# Patient Record
Sex: Male | Born: 1975 | Race: White | Hispanic: No | Marital: Married | Smoking: Never smoker
Health system: Southern US, Community
[De-identification: ages and names within clinical notes are randomized; demographics above are authoritative.]

## PROBLEM LIST (undated history)

## (undated) DIAGNOSIS — E109 Type 1 diabetes mellitus without complications: Secondary | ICD-10-CM

## (undated) DIAGNOSIS — F988 Other specified behavioral and emotional disorders with onset usually occurring in childhood and adolescence: Secondary | ICD-10-CM

## (undated) DIAGNOSIS — F329 Major depressive disorder, single episode, unspecified: Secondary | ICD-10-CM

## (undated) HISTORY — PX: OTHER SURGICAL HISTORY: SHX169

---
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
## 2016-08-29 DIAGNOSIS — I10 Essential (primary) hypertension: Secondary | ICD-10-CM | POA: Diagnosis not present

## 2016-08-29 DIAGNOSIS — E784 Other hyperlipidemia: Secondary | ICD-10-CM | POA: Diagnosis not present

## 2016-08-29 DIAGNOSIS — F900 Attention-deficit hyperactivity disorder, predominantly inattentive type: Secondary | ICD-10-CM | POA: Diagnosis not present

## 2016-08-29 DIAGNOSIS — N182 Chronic kidney disease, stage 2 (mild): Secondary | ICD-10-CM | POA: Diagnosis not present

## 2016-08-29 DIAGNOSIS — E1129 Type 2 diabetes mellitus with other diabetic kidney complication: Secondary | ICD-10-CM | POA: Diagnosis not present

## 2016-08-29 DIAGNOSIS — E668 Other obesity: Secondary | ICD-10-CM | POA: Diagnosis not present

## 2016-11-09 DIAGNOSIS — E1129 Type 2 diabetes mellitus with other diabetic kidney complication: Secondary | ICD-10-CM | POA: Diagnosis not present

## 2016-11-09 DIAGNOSIS — Z794 Long term (current) use of insulin: Secondary | ICD-10-CM | POA: Diagnosis not present

## 2016-11-09 DIAGNOSIS — I10 Essential (primary) hypertension: Secondary | ICD-10-CM | POA: Diagnosis not present

## 2016-11-09 DIAGNOSIS — E668 Other obesity: Secondary | ICD-10-CM | POA: Diagnosis not present

## 2016-11-09 DIAGNOSIS — E784 Other hyperlipidemia: Secondary | ICD-10-CM | POA: Diagnosis not present

## 2016-11-10 DIAGNOSIS — I10 Essential (primary) hypertension: Secondary | ICD-10-CM | POA: Diagnosis not present

## 2017-01-23 DIAGNOSIS — Z125 Encounter for screening for malignant neoplasm of prostate: Secondary | ICD-10-CM | POA: Diagnosis not present

## 2017-01-23 DIAGNOSIS — E1129 Type 2 diabetes mellitus with other diabetic kidney complication: Secondary | ICD-10-CM | POA: Diagnosis not present

## 2017-01-23 DIAGNOSIS — Z0000 Encounter for general adult medical examination without abnormal findings: Secondary | ICD-10-CM | POA: Diagnosis not present

## 2017-01-30 DIAGNOSIS — Z23 Encounter for immunization: Secondary | ICD-10-CM | POA: Diagnosis not present

## 2017-01-30 DIAGNOSIS — E1129 Type 2 diabetes mellitus with other diabetic kidney complication: Secondary | ICD-10-CM | POA: Diagnosis not present

## 2017-01-30 DIAGNOSIS — Z0000 Encounter for general adult medical examination without abnormal findings: Secondary | ICD-10-CM | POA: Diagnosis not present

## 2017-01-30 DIAGNOSIS — E668 Other obesity: Secondary | ICD-10-CM | POA: Diagnosis not present

## 2017-01-30 DIAGNOSIS — Z1389 Encounter for screening for other disorder: Secondary | ICD-10-CM | POA: Diagnosis not present

## 2017-01-30 DIAGNOSIS — Z794 Long term (current) use of insulin: Secondary | ICD-10-CM | POA: Diagnosis not present

## 2017-01-30 DIAGNOSIS — N182 Chronic kidney disease, stage 2 (mild): Secondary | ICD-10-CM | POA: Diagnosis not present

## 2017-03-08 DIAGNOSIS — E1129 Type 2 diabetes mellitus with other diabetic kidney complication: Secondary | ICD-10-CM | POA: Diagnosis not present

## 2017-03-08 DIAGNOSIS — N182 Chronic kidney disease, stage 2 (mild): Secondary | ICD-10-CM | POA: Diagnosis not present

## 2017-03-08 DIAGNOSIS — I10 Essential (primary) hypertension: Secondary | ICD-10-CM | POA: Diagnosis not present

## 2017-03-08 DIAGNOSIS — Z794 Long term (current) use of insulin: Secondary | ICD-10-CM | POA: Diagnosis not present

## 2017-03-26 DIAGNOSIS — E119 Type 2 diabetes mellitus without complications: Secondary | ICD-10-CM | POA: Diagnosis not present

## 2017-03-27 DIAGNOSIS — F909 Attention-deficit hyperactivity disorder, unspecified type: Secondary | ICD-10-CM | POA: Diagnosis not present

## 2017-04-13 DIAGNOSIS — Z794 Long term (current) use of insulin: Secondary | ICD-10-CM | POA: Diagnosis not present

## 2017-04-13 DIAGNOSIS — I10 Essential (primary) hypertension: Secondary | ICD-10-CM | POA: Diagnosis not present

## 2017-04-13 DIAGNOSIS — E1129 Type 2 diabetes mellitus with other diabetic kidney complication: Secondary | ICD-10-CM | POA: Diagnosis not present

## 2017-04-13 DIAGNOSIS — E668 Other obesity: Secondary | ICD-10-CM | POA: Diagnosis not present

## 2017-06-19 DIAGNOSIS — E860 Dehydration: Secondary | ICD-10-CM | POA: Diagnosis not present

## 2017-06-20 DIAGNOSIS — I10 Essential (primary) hypertension: Secondary | ICD-10-CM | POA: Diagnosis not present

## 2017-09-17 DIAGNOSIS — E1129 Type 2 diabetes mellitus with other diabetic kidney complication: Secondary | ICD-10-CM | POA: Diagnosis not present

## 2017-09-17 DIAGNOSIS — Z794 Long term (current) use of insulin: Secondary | ICD-10-CM | POA: Diagnosis not present

## 2017-09-17 DIAGNOSIS — E668 Other obesity: Secondary | ICD-10-CM | POA: Diagnosis not present

## 2017-09-17 DIAGNOSIS — E7849 Other hyperlipidemia: Secondary | ICD-10-CM | POA: Diagnosis not present

## 2017-12-22 DIAGNOSIS — L237 Allergic contact dermatitis due to plants, except food: Secondary | ICD-10-CM | POA: Diagnosis not present

## 2018-01-25 DIAGNOSIS — Z0000 Encounter for general adult medical examination without abnormal findings: Secondary | ICD-10-CM | POA: Diagnosis not present

## 2018-01-25 DIAGNOSIS — Z125 Encounter for screening for malignant neoplasm of prostate: Secondary | ICD-10-CM | POA: Diagnosis not present

## 2018-01-25 DIAGNOSIS — R82998 Other abnormal findings in urine: Secondary | ICD-10-CM | POA: Diagnosis not present

## 2018-01-25 DIAGNOSIS — E1129 Type 2 diabetes mellitus with other diabetic kidney complication: Secondary | ICD-10-CM | POA: Diagnosis not present

## 2018-02-06 DIAGNOSIS — E1129 Type 2 diabetes mellitus with other diabetic kidney complication: Secondary | ICD-10-CM | POA: Diagnosis not present

## 2018-02-06 DIAGNOSIS — N182 Chronic kidney disease, stage 2 (mild): Secondary | ICD-10-CM | POA: Diagnosis not present

## 2018-02-06 DIAGNOSIS — Z23 Encounter for immunization: Secondary | ICD-10-CM | POA: Diagnosis not present

## 2018-02-06 DIAGNOSIS — Z0000 Encounter for general adult medical examination without abnormal findings: Secondary | ICD-10-CM | POA: Diagnosis not present

## 2018-02-06 DIAGNOSIS — Z794 Long term (current) use of insulin: Secondary | ICD-10-CM | POA: Diagnosis not present

## 2018-02-06 DIAGNOSIS — E668 Other obesity: Secondary | ICD-10-CM | POA: Diagnosis not present

## 2018-02-06 DIAGNOSIS — Z1389 Encounter for screening for other disorder: Secondary | ICD-10-CM | POA: Diagnosis not present

## 2018-02-14 DIAGNOSIS — S91119A Laceration without foreign body of unspecified toe without damage to nail, initial encounter: Secondary | ICD-10-CM | POA: Diagnosis not present

## 2018-02-14 DIAGNOSIS — S99922A Unspecified injury of left foot, initial encounter: Secondary | ICD-10-CM | POA: Diagnosis not present

## 2018-02-14 DIAGNOSIS — M79672 Pain in left foot: Secondary | ICD-10-CM | POA: Diagnosis not present

## 2018-02-21 DIAGNOSIS — S99922S Unspecified injury of left foot, sequela: Secondary | ICD-10-CM | POA: Diagnosis not present

## 2018-02-21 DIAGNOSIS — S92405A Nondisplaced unspecified fracture of left great toe, initial encounter for closed fracture: Secondary | ICD-10-CM | POA: Diagnosis not present

## 2018-02-21 DIAGNOSIS — M79672 Pain in left foot: Secondary | ICD-10-CM | POA: Diagnosis not present

## 2018-02-23 ENCOUNTER — Encounter: Payer: Self-pay | Admitting: Behavioral Health

## 2018-02-23 DIAGNOSIS — F988 Other specified behavioral and emotional disorders with onset usually occurring in childhood and adolescence: Secondary | ICD-10-CM

## 2018-02-23 DIAGNOSIS — F319 Bipolar disorder, unspecified: Secondary | ICD-10-CM

## 2018-02-23 DIAGNOSIS — F341 Dysthymic disorder: Secondary | ICD-10-CM | POA: Insufficient documentation

## 2018-02-23 DIAGNOSIS — F902 Attention-deficit hyperactivity disorder, combined type: Secondary | ICD-10-CM | POA: Insufficient documentation

## 2018-02-23 DIAGNOSIS — F329 Major depressive disorder, single episode, unspecified: Secondary | ICD-10-CM

## 2018-02-23 DIAGNOSIS — F32A Depression, unspecified: Secondary | ICD-10-CM

## 2018-02-23 HISTORY — DX: Depression, unspecified: F32.A

## 2018-02-23 HISTORY — DX: Other specified behavioral and emotional disorders with onset usually occurring in childhood and adolescence: F98.8

## 2018-03-24 ENCOUNTER — Encounter: Payer: Self-pay | Admitting: Emergency Medicine

## 2018-04-02 ENCOUNTER — Ambulatory Visit (INDEPENDENT_AMBULATORY_CARE_PROVIDER_SITE_OTHER): Payer: BLUE CROSS/BLUE SHIELD | Admitting: Psychiatry

## 2018-04-02 DIAGNOSIS — E113291 Type 2 diabetes mellitus with mild nonproliferative diabetic retinopathy without macular edema, right eye: Secondary | ICD-10-CM | POA: Diagnosis not present

## 2018-04-02 DIAGNOSIS — F988 Other specified behavioral and emotional disorders with onset usually occurring in childhood and adolescence: Secondary | ICD-10-CM | POA: Diagnosis not present

## 2018-04-02 MED ORDER — METHYLPHENIDATE HCL ER (OSM) 36 MG PO TBCR: 36.0000 mg | EXTENDED_RELEASE_TABLET | Freq: Every day | ORAL | 0 refills | Status: DC

## 2018-04-02 NOTE — Progress Notes (Signed)
[facility]  Patient ID: Benjamin [name],  MRN: [id number]  PCP: Creola Corn, MD  Date of Evaluation: 04/02/2018 Time spent:20 minutes  Chief Complaint:   HISTORY/CURRENT STATUS: HPI patient is a 42 year old white male last seen 01/08/2018.  He was doing well at that time.  Been treated for ADD. He continues to do well. He and family recently moved.   Individual Medical History/ Review of Systems: Changes? :No   Allergies: Patient has no allergy information on record.  Current Medications:  Current Outpatient Medications:  .  [START ON 06/25/2018] methylphenidate 36 MG PO CR tablet, Take 1 tablet (36 mg total) by mouth daily., Disp: 30 tablet, Rfl: 0 .  lisdexamfetamine (VYVANSE) 30 MG capsule, Take 30 mg by mouth every morning., Disp: , Rfl:  Medication Side Effects: none  Family Medical/ Social History: Changes? No  MENTAL HEALTH EXAM:  There were no vitals taken for this visit.There is no height or weight on file to calculate BMI.  General Appearance: Casual  Eye Contact:  Good  Speech:  Normal Rate  Volume:  Normal  Mood:  Euthymic  Affect:  Appropriate  Thought Process:  Linear  Orientation:  Full (Time, Place, and Person)  Thought Content: Logical   Suicidal Thoughts:  No  Homicidal Thoughts:  No  Memory:  normal  Judgement:  Good  Insight:  Good  Psychomotor Activity:  Normal  Concentration:  Concentration: Good  Recall:  Good  Fund of Knowledge: Good  Language: Good  Assets:  Desire for Improvement  ADL's:  Intact  Cognition: WNL  Prognosis:  Good    DIAGNOSES:    ICD-10-CM   1. Attention deficit disorder, unspecified hyperactivity presence F98.8     Receiving Psychotherapy: No    RECOMMENDATIONS: no change in plan. Recheck 4 months.   Anne Fu, PA-C

## 2018-05-13 DIAGNOSIS — E1129 Type 2 diabetes mellitus with other diabetic kidney complication: Secondary | ICD-10-CM | POA: Diagnosis not present

## 2018-05-13 DIAGNOSIS — Z1389 Encounter for screening for other disorder: Secondary | ICD-10-CM | POA: Diagnosis not present

## 2018-07-10 DIAGNOSIS — M791 Myalgia, unspecified site: Secondary | ICD-10-CM | POA: Diagnosis not present

## 2018-07-10 DIAGNOSIS — J111 Influenza due to unidentified influenza virus with other respiratory manifestations: Secondary | ICD-10-CM | POA: Diagnosis not present

## 2018-07-10 DIAGNOSIS — E782 Mixed hyperlipidemia: Secondary | ICD-10-CM | POA: Diagnosis not present

## 2018-07-10 DIAGNOSIS — E1165 Type 2 diabetes mellitus with hyperglycemia: Secondary | ICD-10-CM | POA: Diagnosis not present

## 2018-08-02 ENCOUNTER — Ambulatory Visit (INDEPENDENT_AMBULATORY_CARE_PROVIDER_SITE_OTHER): Payer: BLUE CROSS/BLUE SHIELD | Admitting: Psychiatry

## 2018-08-02 DIAGNOSIS — F988 Other specified behavioral and emotional disorders with onset usually occurring in childhood and adolescence: Secondary | ICD-10-CM | POA: Diagnosis not present

## 2018-08-02 MED ORDER — METHYLPHENIDATE HCL ER (OSM) 36 MG PO TBCR: 36.0000 mg | EXTENDED_RELEASE_TABLET | Freq: Every day | ORAL | 0 refills | Status: DC

## 2018-08-02 NOTE — Progress Notes (Signed)
[facility]  Patient ID: MCADOO LIVERPOOL,  MRN: [id number]  PCP: Creola Corn, MD  Date of Evaluation: 08/02/2018 Time spent:20 minutes  Chief Complaint:   HISTORY/CURRENT STATUS: HPI patient seen 4 months ago was doing well. Continues to do well.  Individual Medical History/ Review of Systems: Changes? :No   Allergies: Patient has no allergy information on record.  Current Medications:  Current Outpatient Medications:  .  methylphenidate 36 MG PO CR tablet, Take 1 tablet (36 mg total) by mouth daily., Disp: 30 tablet, Rfl: 0 .  lisdexamfetamine (VYVANSE) 30 MG capsule, Take 30 mg by mouth every morning., Disp: , Rfl:  .  methylphenidate (CONCERTA) 36 MG PO CR tablet, Take 1 tablet (36 mg total) by mouth daily., Disp: 30 tablet, Rfl: 0 .  methylphenidate (CONCERTA) 36 MG PO CR tablet, Take 1 tablet (36 mg total) by mouth daily., Disp: 30 tablet, Rfl: 0 Medication Side Effects: no  Family Medical/ Social History: Changes? no  MENTAL HEALTH EXAM:  There were no vitals taken for this visit.There is no height or weight on file to calculate BMI. 154/100 with pulse 100  General Appearance: Casual  Eye Contact:  Good  Speech:  Clear and Coherent  Volume:  Normal  Mood:  Euthymic  Affect:  Appropriate  Thought Process:  Linear  Orientation:  Full (Time, Place, and Person)  Thought Content: Logical   Suicidal Thoughts:  No  Homicidal Thoughts:  No  Memory:  WNL  Judgement:  Good  Insight:  Good  Psychomotor Activity:  Normal  Concentration:  Concentration: Good  Recall:  Good  Fund of Knowledge: Good  Language: Good  Assets:  Desire for Improvement  ADL's:  Intact  Cognition: WNL  Prognosis:  Good    DIAGNOSES:    ICD-10-CM   1. Attention deficit disorder, unspecified hyperactivity presence F98.8     Receiving Psychotherapy: No    RECOMMENDATIONS: doing well. No change from Concerta cr 36 mg/day. Follow VS and call if elevated. Recheck 3  months   Anne Fu, [place]

## 2018-08-08 DIAGNOSIS — E668 Other obesity: Secondary | ICD-10-CM | POA: Diagnosis not present

## 2018-08-08 DIAGNOSIS — N182 Chronic kidney disease, stage 2 (mild): Secondary | ICD-10-CM | POA: Diagnosis not present

## 2018-08-08 DIAGNOSIS — I10 Essential (primary) hypertension: Secondary | ICD-10-CM | POA: Diagnosis not present

## 2018-08-08 DIAGNOSIS — E1129 Type 2 diabetes mellitus with other diabetic kidney complication: Secondary | ICD-10-CM | POA: Diagnosis not present

## 2018-11-01 ENCOUNTER — Ambulatory Visit: Payer: BLUE CROSS/BLUE SHIELD | Admitting: Psychiatry

## 2018-11-04 ENCOUNTER — Other Ambulatory Visit: Payer: Self-pay

## 2018-11-04 ENCOUNTER — Ambulatory Visit (INDEPENDENT_AMBULATORY_CARE_PROVIDER_SITE_OTHER): Payer: PRIVATE HEALTH INSURANCE | Admitting: Psychiatry

## 2018-11-04 ENCOUNTER — Encounter: Payer: Self-pay | Admitting: Psychiatry

## 2018-11-04 VITALS — BP 118/84 | HR 78 | Ht 71.5 in | Wt 224.0 lb

## 2018-11-04 DIAGNOSIS — E1129 Type 2 diabetes mellitus with other diabetic kidney complication: Secondary | ICD-10-CM | POA: Diagnosis not present

## 2018-11-04 DIAGNOSIS — E669 Obesity, unspecified: Secondary | ICD-10-CM | POA: Diagnosis not present

## 2018-11-04 DIAGNOSIS — I10 Essential (primary) hypertension: Secondary | ICD-10-CM | POA: Diagnosis not present

## 2018-11-04 DIAGNOSIS — Z794 Long term (current) use of insulin: Secondary | ICD-10-CM | POA: Diagnosis not present

## 2018-11-04 DIAGNOSIS — F341 Dysthymic disorder: Secondary | ICD-10-CM | POA: Diagnosis not present

## 2018-11-04 DIAGNOSIS — F902 Attention-deficit hyperactivity disorder, combined type: Secondary | ICD-10-CM | POA: Diagnosis not present

## 2018-11-04 MED ORDER — METHYLPHENIDATE HCL ER (OSM) 27 MG PO TBCR: 27.0000 mg | EXTENDED_RELEASE_TABLET | Freq: Every day | ORAL | 0 refills | Status: DC
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
## 2018-11-11 DIAGNOSIS — Z794 Long term (current) use of insulin: Secondary | ICD-10-CM | POA: Diagnosis not present

## 2018-11-11 DIAGNOSIS — L739 Follicular disorder, unspecified: Secondary | ICD-10-CM | POA: Diagnosis not present

## 2018-11-11 DIAGNOSIS — L239 Allergic contact dermatitis, unspecified cause: Secondary | ICD-10-CM | POA: Diagnosis not present

## 2018-11-11 DIAGNOSIS — E1129 Type 2 diabetes mellitus with other diabetic kidney complication: Secondary | ICD-10-CM | POA: Diagnosis not present

## 2018-12-25 DIAGNOSIS — Z03818 Encounter for observation for suspected exposure to other biological agents ruled out: Secondary | ICD-10-CM | POA: Diagnosis not present

## 2019-01-21 DIAGNOSIS — M7502 Adhesive capsulitis of left shoulder: Secondary | ICD-10-CM | POA: Diagnosis not present

## 2019-01-21 DIAGNOSIS — R52 Pain, unspecified: Secondary | ICD-10-CM | POA: Diagnosis not present

## 2019-02-04 ENCOUNTER — Encounter: Payer: Self-pay | Admitting: Psychiatry

## 2019-02-04 ENCOUNTER — Ambulatory Visit (INDEPENDENT_AMBULATORY_CARE_PROVIDER_SITE_OTHER): Payer: PRIVATE HEALTH INSURANCE | Admitting: Psychiatry

## 2019-02-04 ENCOUNTER — Other Ambulatory Visit: Payer: Self-pay

## 2019-02-04 VITALS — Ht 71.5 in | Wt 214.0 lb

## 2019-02-04 DIAGNOSIS — F902 Attention-deficit hyperactivity disorder, combined type: Secondary | ICD-10-CM

## 2019-02-04 DIAGNOSIS — F341 Dysthymic disorder: Secondary | ICD-10-CM

## 2019-02-04 MED ORDER — METHYLPHENIDATE HCL ER (OSM) 27 MG PO TBCR: 27.0000 mg | EXTENDED_RELEASE_TABLET | Freq: Every day | ORAL | 0 refills | Status: DC
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
## 2019-02-07 DIAGNOSIS — E1129 Type 2 diabetes mellitus with other diabetic kidney complication: Secondary | ICD-10-CM | POA: Diagnosis not present

## 2019-02-07 DIAGNOSIS — Z0000 Encounter for general adult medical examination without abnormal findings: Secondary | ICD-10-CM | POA: Diagnosis not present

## 2019-02-07 DIAGNOSIS — Z125 Encounter for screening for malignant neoplasm of prostate: Secondary | ICD-10-CM | POA: Diagnosis not present

## 2019-02-14 DIAGNOSIS — Z0000 Encounter for general adult medical examination without abnormal findings: Secondary | ICD-10-CM | POA: Diagnosis not present

## 2019-02-14 DIAGNOSIS — E1129 Type 2 diabetes mellitus with other diabetic kidney complication: Secondary | ICD-10-CM | POA: Diagnosis not present

## 2019-02-14 DIAGNOSIS — E11319 Type 2 diabetes mellitus with unspecified diabetic retinopathy without macular edema: Secondary | ICD-10-CM | POA: Diagnosis not present

## 2019-02-14 DIAGNOSIS — E785 Hyperlipidemia, unspecified: Secondary | ICD-10-CM | POA: Diagnosis not present

## 2019-02-14 DIAGNOSIS — M7502 Adhesive capsulitis of left shoulder: Secondary | ICD-10-CM | POA: Diagnosis not present

## 2019-02-14 DIAGNOSIS — Z1331 Encounter for screening for depression: Secondary | ICD-10-CM | POA: Diagnosis not present

## 2019-02-18 ENCOUNTER — Other Ambulatory Visit: Payer: Self-pay | Admitting: Internal Medicine

## 2019-02-18 DIAGNOSIS — E785 Hyperlipidemia, unspecified: Secondary | ICD-10-CM

## 2019-02-19 DIAGNOSIS — I10 Essential (primary) hypertension: Secondary | ICD-10-CM | POA: Diagnosis not present

## 2019-02-19 DIAGNOSIS — E10319 Type 1 diabetes mellitus with unspecified diabetic retinopathy without macular edema: Secondary | ICD-10-CM | POA: Diagnosis not present

## 2019-02-19 DIAGNOSIS — N182 Chronic kidney disease, stage 2 (mild): Secondary | ICD-10-CM | POA: Diagnosis not present

## 2019-02-19 DIAGNOSIS — Z794 Long term (current) use of insulin: Secondary | ICD-10-CM | POA: Diagnosis not present

## 2019-02-19 DIAGNOSIS — Z23 Encounter for immunization: Secondary | ICD-10-CM | POA: Diagnosis not present

## 2019-05-19 DIAGNOSIS — Z20828 Contact with and (suspected) exposure to other viral communicable diseases: Secondary | ICD-10-CM | POA: Diagnosis not present

## 2019-05-19 DIAGNOSIS — R52 Pain, unspecified: Secondary | ICD-10-CM | POA: Diagnosis not present

## 2019-05-19 DIAGNOSIS — U071 COVID-19: Secondary | ICD-10-CM | POA: Diagnosis not present

## 2019-05-19 DIAGNOSIS — R5383 Other fatigue: Secondary | ICD-10-CM | POA: Diagnosis not present

## 2019-05-27 ENCOUNTER — Telehealth: Payer: Self-pay | Admitting: Unknown Physician Specialty

## 2019-05-27 NOTE — Telephone Encounter (Signed)
Called to discuss with patient about Covid symptoms and the use of bamlanivimab, a monoclonal antibody infusion for those with mild to moderate Covid symptoms and at a high risk of hospitalization.  Pt is not qualified for this infusion due to age <65 and lack of qualifying co-morbid conditions.     Message left to call back for questions

## 2019-05-28 ENCOUNTER — Telehealth: Payer: Self-pay | Admitting: Unknown Physician Specialty

## 2019-05-28 ENCOUNTER — Other Ambulatory Visit: Payer: Self-pay | Admitting: Unknown Physician Specialty

## 2019-05-28 ENCOUNTER — Other Ambulatory Visit: Payer: Self-pay | Admitting: Critical Care Medicine

## 2019-05-28 DIAGNOSIS — E1165 Type 2 diabetes mellitus with hyperglycemia: Secondary | ICD-10-CM

## 2019-05-28 DIAGNOSIS — U071 COVID-19: Secondary | ICD-10-CM

## 2019-05-28 NOTE — Telephone Encounter (Signed)
Left message yesterday.  Number in chart wrong.     I connected by phone with Benjamin [name] on 05/28/2019 at 11:38 AM to discuss the potential use of an new treatment for mild to moderate COVID-19 viral infection in non-hospitalized patients.  This patient is a 43 y.o. male that meets the FDA criteria for Emergency Use Authorization of bamlanivimab or casirivimab\imdevimab.  Has a (+) direct SARS-CoV-2 viral test result  Has mild or moderate COVID-19   Is ? 42 years of age and weighs ? 40 kg  Is NOT hospitalized due to COVID-19  Is NOT requiring oxygen therapy or requiring an increase in baseline oxygen flow rate due to COVID-19  Is within 10 days of symptom onset  Has at least one of the high risk factor(s) for progression to severe COVID-19 and/or hospitalization as defined in EUA.  Specific high risk criteria : Diabetes   I have spoken and communicated the following to the patient or parent/caregiver:  1. FDA has authorized the emergency use of bamlanivimab and casirivimab\imdevimab for the treatment of mild to moderate COVID-19 in adults and pediatric patients with positive results of direct SARS-CoV-2 viral testing who are 45 years of age and older weighing at least 40 kg, and who are at high risk for progressing to severe COVID-19 and/or hospitalization.  2. The significant known and potential risks and benefits of bamlanivimab and casirivimab\imdevimab, and the extent to which such potential risks and benefits are unknown.  3. Information on available alternative treatments and the risks and benefits of those alternatives, including clinical trials.  4. Patients treated with bamlanivimab and casirivimab\imdevimab should continue to self-isolate and use infection control measures (e.g., wear mask, isolate, social distance, avoid sharing personal items, clean and disinfect "high touch" surfaces, and frequent handwashing) according to CDC guidelines.   5. The patient or  parent/caregiver has the option to accept or refuse bamlanivimab or casirivimab\imdevimab .  After reviewing this information with the patient, The patient agreed to proceed with receiving the casirivimab\imdevimab infusion and will be provided a copy of the Fact sheet prior to receiving the infusion.[name] 05/28/2019 11:38 AM  Symptoms for 9 days.

## 2019-05-28 NOTE — Progress Notes (Signed)
  I connected by phone with Benjamin [name] on 05/28/2019 at 12:06 PM to discuss the potential use of an new treatment for mild to moderate COVID-19 viral infection in non-hospitalized patients.  This patient is a 43 y.o. male that meets the FDA criteria for Emergency Use Authorization of bamlanivimab or casirivimab\imdevimab.  Has a (+) direct SARS-CoV-2 viral test result  Has mild or moderate COVID-19   Is ? 42 years of age and weighs ? 40 kg  Is NOT hospitalized due to COVID-19  Is NOT requiring oxygen therapy or requiring an increase in baseline oxygen flow rate due to COVID-19  Is within 10 days of symptom onset  Has at least one of the high risk factor(s) for progression to severe COVID-19 and/or hospitalization as defined in EUA.  Specific high risk criteria : Diabetes   I have spoken and communicated the following to the patient or parent/caregiver:  1. FDA has authorized the emergency use of bamlanivimab and casirivimab\imdevimab for the treatment of mild to moderate COVID-19 in adults and pediatric patients with positive results of direct SARS-CoV-2 viral testing who are 67 years of age and older weighing at least 40 kg, and who are at high risk for progressing to severe COVID-19 and/or hospitalization.  2. The significant known and potential risks and benefits of bamlanivimab and casirivimab\imdevimab, and the extent to which such potential risks and benefits are unknown.  3. Information on available alternative treatments and the risks and benefits of those alternatives, including clinical trials.  4. Patients treated with bamlanivimab and casirivimab\imdevimab should continue to self-isolate and use infection control measures (e.g., wear mask, isolate, social distance, avoid sharing personal items, clean and disinfect "high touch" surfaces, and frequent handwashing) according to CDC guidelines.   5. The patient or parent/caregiver has the option to accept or refuse  bamlanivimab or casirivimab\imdevimab .  After reviewing this information with the patient, The patient agreed to proceed with receiving the bamlanimivab infusion and will be provided a copy of the Fact sheet prior to receiving the infusion.Benjamin [name] 05/28/2019 12:06 PM

## 2019-05-29 ENCOUNTER — Emergency Department (HOSPITAL_COMMUNITY): Payer: BC Managed Care – PPO

## 2019-05-29 ENCOUNTER — Inpatient Hospital Stay (HOSPITAL_COMMUNITY)
Admission: EM | Admit: 2019-05-29 | Discharge: 2019-06-03 | DRG: 177 | Disposition: A | Discharge status: Home / Self Care | Payer: BC Managed Care – PPO | Attending: Internal Medicine | Admitting: Internal Medicine

## 2019-05-29 ENCOUNTER — Inpatient Hospital Stay (HOSPITAL_COMMUNITY): Admission: RE | Admit: 2019-05-29 | Payer: Self-pay | Source: Ambulatory Visit

## 2019-05-29 ENCOUNTER — Other Ambulatory Visit: Payer: Self-pay

## 2019-05-29 ENCOUNTER — Encounter (HOSPITAL_COMMUNITY): Payer: Self-pay | Admitting: Emergency Medicine

## 2019-05-29 DIAGNOSIS — Z79899 Other long term (current) drug therapy: Secondary | ICD-10-CM | POA: Diagnosis not present

## 2019-05-29 DIAGNOSIS — E876 Hypokalemia: Secondary | ICD-10-CM | POA: Diagnosis not present

## 2019-05-29 DIAGNOSIS — U071 COVID-19: Secondary | ICD-10-CM | POA: Diagnosis not present

## 2019-05-29 DIAGNOSIS — R680 Hypothermia, not associated with low environmental temperature: Secondary | ICD-10-CM | POA: Diagnosis present

## 2019-05-29 DIAGNOSIS — E1010 Type 1 diabetes mellitus with ketoacidosis without coma: Secondary | ICD-10-CM

## 2019-05-29 DIAGNOSIS — F909 Attention-deficit hyperactivity disorder, unspecified type: Secondary | ICD-10-CM | POA: Diagnosis present

## 2019-05-29 DIAGNOSIS — R531 Weakness: Secondary | ICD-10-CM | POA: Diagnosis not present

## 2019-05-29 DIAGNOSIS — E872 Acidosis: Secondary | ICD-10-CM | POA: Diagnosis not present

## 2019-05-29 DIAGNOSIS — E860 Dehydration: Secondary | ICD-10-CM | POA: Diagnosis not present

## 2019-05-29 DIAGNOSIS — I10 Essential (primary) hypertension: Secondary | ICD-10-CM | POA: Diagnosis not present

## 2019-05-29 DIAGNOSIS — N179 Acute kidney failure, unspecified: Secondary | ICD-10-CM | POA: Diagnosis not present

## 2019-05-29 DIAGNOSIS — Z888 Allergy status to other drugs, medicaments and biological substances status: Secondary | ICD-10-CM | POA: Diagnosis not present

## 2019-05-29 DIAGNOSIS — E0810 Diabetes mellitus due to underlying condition with ketoacidosis without coma: Secondary | ICD-10-CM | POA: Diagnosis not present

## 2019-05-29 DIAGNOSIS — F1290 Cannabis use, unspecified, uncomplicated: Secondary | ICD-10-CM | POA: Diagnosis not present

## 2019-05-29 DIAGNOSIS — R109 Unspecified abdominal pain: Secondary | ICD-10-CM | POA: Diagnosis not present

## 2019-05-29 DIAGNOSIS — Z794 Long term (current) use of insulin: Secondary | ICD-10-CM | POA: Diagnosis not present

## 2019-05-29 DIAGNOSIS — E875 Hyperkalemia: Secondary | ICD-10-CM | POA: Diagnosis present

## 2019-05-29 DIAGNOSIS — E785 Hyperlipidemia, unspecified: Secondary | ICD-10-CM | POA: Diagnosis present

## 2019-05-29 DIAGNOSIS — E1110 Type 2 diabetes mellitus with ketoacidosis without coma: Secondary | ICD-10-CM

## 2019-05-29 DIAGNOSIS — R112 Nausea with vomiting, unspecified: Secondary | ICD-10-CM

## 2019-05-29 HISTORY — DX: Type 1 diabetes mellitus without complications: E10.9

## 2019-05-29 LAB — CBC
HCT: 49.6 % (ref 39.0–52.0)
Hemoglobin: 16.8 g/dL (ref 13.0–17.0)
MCH: 29.5 pg (ref 26.0–34.0)
MCHC: 33.9 g/dL (ref 30.0–36.0)
MCV: 87 fL (ref 80.0–100.0)
Platelets: 308 10*3/uL (ref 150–400)
RBC: 5.7 MIL/uL (ref 4.22–5.81)
RDW: 12.2 % (ref 11.5–15.5)
WBC: 18.3 10*3/uL — ABNORMAL HIGH (ref 4.0–10.5)
nRBC: 0 % (ref 0.0–0.2)

## 2019-05-29 LAB — CBC WITH DIFFERENTIAL/PLATELET
Abs Immature Granulocytes: 0.39 10*3/uL — ABNORMAL HIGH (ref 0.00–0.07)
Basophils Absolute: 0.1 10*3/uL (ref 0.0–0.1)
Basophils Relative: 1 %
Eosinophils Absolute: 0.3 10*3/uL (ref 0.0–0.5)
Eosinophils Relative: 2 %
HCT: 58.2 % — ABNORMAL HIGH (ref 39.0–52.0)
Hemoglobin: 19.2 g/dL — ABNORMAL HIGH (ref 13.0–17.0)
Immature Granulocytes: 2 %
Lymphocytes Relative: 9 %
Lymphs Abs: 1.5 10*3/uL (ref 0.7–4.0)
MCH: 29.1 pg (ref 26.0–34.0)
MCHC: 33 g/dL (ref 30.0–36.0)
MCV: 88.3 fL (ref 80.0–100.0)
Monocytes Absolute: 1.1 10*3/uL — ABNORMAL HIGH (ref 0.1–1.0)
Monocytes Relative: 7 %
Neutro Abs: 13.7 10*3/uL — ABNORMAL HIGH (ref 1.7–7.7)
Neutrophils Relative %: 79 %
Platelets: 451 10*3/uL — ABNORMAL HIGH (ref 150–400)
RBC: 6.59 MIL/uL — ABNORMAL HIGH (ref 4.22–5.81)
RDW: 12.6 % (ref 11.5–15.5)
WBC: 17.1 10*3/uL — ABNORMAL HIGH (ref 4.0–10.5)
nRBC: 0 % (ref 0.0–0.2)

## 2019-05-29 LAB — BETA-HYDROXYBUTYRIC ACID: Beta-Hydroxybutyric Acid: >8 mmol/L — ABNORMAL HIGH (ref 0.05–0.27)

## 2019-05-29 LAB — BLOOD GAS, VENOUS
Acid-base deficit: 27.3 mmol/L — ABNORMAL HIGH (ref 0.0–2.0)
Bicarbonate: 5 mmol/L — ABNORMAL LOW (ref 20.0–28.0)
O2 Saturation: 72.8 %
Patient temperature: 98.6
pCO2, Ven: 19.5 mmHg — CL (ref 44.0–60.0)
pH, Ven: 7.034 — CL (ref 7.250–7.430)
pO2, Ven: 46.6 mmHg — ABNORMAL HIGH (ref 32.0–45.0)

## 2019-05-29 LAB — COMPREHENSIVE METABOLIC PANEL
ALT: 22 U/L (ref 0–44)
AST: 16 U/L (ref 15–41)
Albumin: 3.9 g/dL (ref 3.5–5.0)
Alkaline Phosphatase: 95 U/L (ref 38–126)
BUN: 54 mg/dL — ABNORMAL HIGH (ref 6–20)
CO2: <7 mmol/L — ABNORMAL LOW (ref 22–32)
Calcium: 9.6 mg/dL (ref 8.9–10.3)
Chloride: 93 mmol/L — ABNORMAL LOW (ref 98–111)
Creatinine, Ser: 2.31 mg/dL — ABNORMAL HIGH (ref 0.61–1.24)
GFR calc Af Amer: 39 mL/min — ABNORMAL LOW (ref >60)
GFR calc non Af Amer: 34 mL/min — ABNORMAL LOW (ref >60)
Glucose, Bld: 719 mg/dL (ref 70–99)
Potassium: 5.3 mmol/L — ABNORMAL HIGH (ref 3.5–5.1)
Sodium: 131 mmol/L — ABNORMAL LOW (ref 135–145)
Total Bilirubin: 2 mg/dL — ABNORMAL HIGH (ref 0.3–1.2)
Total Protein: 9.1 g/dL — ABNORMAL HIGH (ref 6.5–8.1)

## 2019-05-29 LAB — BASIC METABOLIC PANEL
Anion gap: 23 — ABNORMAL HIGH (ref 5–15)
BUN: 50 mg/dL — ABNORMAL HIGH (ref 6–20)
BUN: 49 mg/dL — ABNORMAL HIGH (ref 6–20)
CO2: <7 mmol/L — ABNORMAL LOW (ref 22–32)
CO2: 9 mmol/L — ABNORMAL LOW (ref 22–32)
Calcium: 7.7 mg/dL — ABNORMAL LOW (ref 8.9–10.3)
Calcium: 8.7 mg/dL — ABNORMAL LOW (ref 8.9–10.3)
Chloride: 112 mmol/L — ABNORMAL HIGH (ref 98–111)
Chloride: 108 mmol/L (ref 98–111)
Creatinine, Ser: 1.82 mg/dL — ABNORMAL HIGH (ref 0.61–1.24)
Creatinine, Ser: 1.8 mg/dL — ABNORMAL HIGH (ref 0.61–1.24)
GFR calc Af Amer: 52 mL/min — ABNORMAL LOW (ref >60)
GFR calc Af Amer: 53 mL/min — ABNORMAL LOW (ref >60)
GFR calc non Af Amer: 45 mL/min — ABNORMAL LOW (ref >60)
GFR calc non Af Amer: 45 mL/min — ABNORMAL LOW (ref >60)
Glucose, Bld: 394 mg/dL — ABNORMAL HIGH (ref 70–99)
Glucose, Bld: 344 mg/dL — ABNORMAL HIGH (ref 70–99)
Potassium: 3.6 mmol/L (ref 3.5–5.1)
Potassium: 4.7 mmol/L (ref 3.5–5.1)
Sodium: 138 mmol/L (ref 135–145)
Sodium: 140 mmol/L (ref 135–145)

## 2019-05-29 LAB — URINALYSIS, ROUTINE W REFLEX MICROSCOPIC
Bacteria, UA: NONE SEEN
Bilirubin Urine: NEGATIVE
Glucose, UA: >=500 mg/dL — AB
Ketones, ur: 80 mg/dL — AB
Leukocytes,Ua: NEGATIVE
Nitrite: NEGATIVE
Protein, ur: 100 mg/dL — AB
Specific Gravity, Urine: 1.018 (ref 1.005–1.030)
pH: 5 (ref 5.0–8.0)

## 2019-05-29 LAB — MRSA PCR SCREENING: MRSA by PCR: NEGATIVE

## 2019-05-29 LAB — PROCALCITONIN: Procalcitonin: <0.1 ng/mL

## 2019-05-29 LAB — LACTIC ACID, PLASMA
Lactic Acid, Venous: 3.8 mmol/L (ref 0.5–1.9)
Lactic Acid, Venous: 2.7 mmol/L (ref 0.5–1.9)
Lactic Acid, Venous: 2.8 mmol/L (ref 0.5–1.9)

## 2019-05-29 LAB — CBG MONITORING, ED
Glucose-Capillary: >600 mg/dL (ref 70–99)
Glucose-Capillary: >600 mg/dL (ref 70–99)
Glucose-Capillary: 561 mg/dL (ref 70–99)
Glucose-Capillary: 450 mg/dL — ABNORMAL HIGH (ref 70–99)

## 2019-05-29 LAB — HIV ANTIBODY (ROUTINE TESTING W REFLEX): HIV Screen 4th Generation wRfx: NONREACTIVE

## 2019-05-29 LAB — TROPONIN I (HIGH SENSITIVITY)
Troponin I (High Sensitivity): 11 ng/L (ref <18)
Troponin I (High Sensitivity): 11 ng/L (ref <18)

## 2019-05-29 LAB — FIBRINOGEN: Fibrinogen: >800 mg/dL — ABNORMAL HIGH (ref 210–475)

## 2019-05-29 LAB — LACTATE DEHYDROGENASE: LDH: 195 U/L — ABNORMAL HIGH (ref 98–192)

## 2019-05-29 LAB — C-REACTIVE PROTEIN: CRP: 2.2 mg/dL — ABNORMAL HIGH (ref <1.0)

## 2019-05-29 LAB — GLUCOSE, CAPILLARY
Glucose-Capillary: 312 mg/dL — ABNORMAL HIGH (ref 70–99)
Glucose-Capillary: 236 mg/dL — ABNORMAL HIGH (ref 70–99)
Glucose-Capillary: 225 mg/dL — ABNORMAL HIGH (ref 70–99)
Glucose-Capillary: 205 mg/dL — ABNORMAL HIGH (ref 70–99)

## 2019-05-29 LAB — TRIGLYCERIDES: Triglycerides: 787 mg/dL — ABNORMAL HIGH (ref <150)

## 2019-05-29 LAB — D-DIMER, QUANTITATIVE: D-Dimer, Quant: 2.65 ug/mL-FEU — ABNORMAL HIGH (ref 0.00–0.50)

## 2019-05-29 LAB — FERRITIN: Ferritin: 4967 ng/mL — ABNORMAL HIGH (ref 24–336)

## 2019-05-29 LAB — ABO/RH: ABO/RH(D): A POS

## 2019-05-29 MED ORDER — LISINOPRIL 10 MG PO TABS
20.0000 mg | ORAL_TABLET | Freq: Every day | ORAL | Status: DC
  Administered 2019-05-29 – 2019-05-30 (×2): 20 mg via ORAL
  Filled 2019-05-29 (×2): qty 2

## 2019-05-29 MED ORDER — DEXTROSE-NACL 5-0.45 % IV SOLN: INTRAVENOUS | Status: DC

## 2019-05-29 MED ORDER — SODIUM CHLORIDE 0.9 % IV BOLUS
1000.0000 mL | Freq: Once | INTRAVENOUS | Status: AC
  Administered 2019-05-29: 1000 mL via INTRAVENOUS

## 2019-05-29 MED ORDER — POTASSIUM CHLORIDE 10 MEQ/100ML IV SOLN
10.0000 meq | INTRAVENOUS | Status: AC
  Administered 2019-05-29 (×4): 10 meq via INTRAVENOUS
  Filled 2019-05-29 (×4): qty 100

## 2019-05-29 MED ORDER — INSULIN REGULAR(HUMAN) IN NACL 100-0.9 UT/100ML-% IV SOLN
INTRAVENOUS | Status: DC
  Administered 2019-05-29: 14 [IU]/h via INTRAVENOUS
  Administered 2019-05-29: 13 [IU]/h via INTRAVENOUS
  Administered 2019-05-30: 15:00:00 5.5 [IU]/h via INTRAVENOUS
  Filled 2019-05-29 (×3): qty 100

## 2019-05-29 MED ORDER — LACTATED RINGERS IV BOLUS
1000.0000 mL | Freq: Once | INTRAVENOUS | Status: AC
  Administered 2019-05-29: 1000 mL via INTRAVENOUS

## 2019-05-29 MED ORDER — SODIUM CHLORIDE 0.9 % IV SOLN
100.0000 mg | Freq: Every day | INTRAVENOUS | Status: AC
  Administered 2019-05-30 – 2019-06-02 (×4): 100 mg via INTRAVENOUS
  Filled 2019-05-29 (×4): qty 100

## 2019-05-29 MED ORDER — SODIUM CHLORIDE 0.9 % IV SOLN: INTRAVENOUS | Status: DC

## 2019-05-29 MED ORDER — SODIUM CHLORIDE 0.9 % IV SOLN
200.0000 mg | Freq: Once | INTRAVENOUS | Status: AC
  Administered 2019-05-29: 200 mg via INTRAVENOUS
  Filled 2019-05-29: qty 200

## 2019-05-29 MED ORDER — DEXTROSE 50 % IV SOLN: 0.0000 mL | INTRAVENOUS | Status: DC | PRN

## 2019-05-29 MED ORDER — ONDANSETRON HCL 4 MG/2ML IJ SOLN
4.0000 mg | Freq: Once | INTRAMUSCULAR | Status: AC
  Administered 2019-05-29: 4 mg via INTRAVENOUS

## 2019-05-29 MED ORDER — ENOXAPARIN SODIUM 40 MG/0.4ML ~~LOC~~ SOLN
40.0000 mg | SUBCUTANEOUS | Status: DC
  Administered 2019-05-29 – 2019-06-02 (×5): 40 mg via SUBCUTANEOUS
  Filled 2019-05-29 (×6): qty 0.4

## 2019-05-29 MED ORDER — SODIUM CHLORIDE 0.9 % IV SOLN
8.0000 mg | Freq: Three times a day (TID) | INTRAVENOUS | Status: DC
  Filled 2019-05-29 (×3): qty 4

## 2019-05-29 MED ORDER — LACTATED RINGERS IV SOLN: INTRAVENOUS | Status: DC

## 2019-05-29 MED ORDER — METHYLPHENIDATE HCL ER (OSM) 27 MG PO TBCR: 27.0000 mg | EXTENDED_RELEASE_TABLET | Freq: Every day | ORAL | Status: DC

## 2019-05-29 MED ORDER — PHENOL 1.4 % MT LIQD
1.0000 | OROMUCOSAL | Status: DC | PRN
  Administered 2019-05-29: 1 via OROMUCOSAL
  Filled 2019-05-29: qty 177

## 2019-05-29 MED ORDER — ONDANSETRON HCL 4 MG/2ML IJ SOLN
INTRAMUSCULAR | Status: AC
  Filled 2019-05-29: qty 4

## 2019-05-29 MED ORDER — SODIUM CHLORIDE 0.9 % IV BOLUS
500.0000 mL | Freq: Once | INTRAVENOUS | Status: AC
  Administered 2019-05-29: 500 mL via INTRAVENOUS

## 2019-05-29 MED ORDER — ORAL CARE MOUTH RINSE
15.0000 mL | Freq: Two times a day (BID) | OROMUCOSAL | Status: DC
  Administered 2019-05-29 – 2019-06-03 (×8): 15 mL via OROMUCOSAL

## 2019-05-29 MED ORDER — SODIUM CHLORIDE (PF) 0.9 % IJ SOLN
INTRAMUSCULAR | Status: AC
  Filled 2019-05-29: qty 50

## 2019-05-29 MED ORDER — LORAZEPAM 2 MG/ML IJ SOLN
0.5000 mg | Freq: Once | INTRAMUSCULAR | Status: AC
  Administered 2019-05-29: 0.5 mg via INTRAVENOUS
  Filled 2019-05-29: qty 1

## 2019-05-29 NOTE — Plan of Care (Signed)
  Problem: Education: Goal: Knowledge of General Education information will improve Description Including pain rating scale, medication(s)/side effects and non-pharmacologic comfort measures Outcome: Progressing   

## 2019-05-29 NOTE — ED Notes (Signed)
CRITICAL VALUE STICKER  CRITICAL VALUE: pH of 7.034 and PCO2 19.5   RECEIVER (on-site recipient of call): N.Raneisha Bress,RN  DATE & TIME NOTIFIED: 05/29/2019 1409  MESSENGER (representative from lab): Sarah   MD NOTIFIED: [place] MG  TIME OF NOTIFICATION: 1409  RESPONSE:  1 L bolus will be ordered

## 2019-05-29 NOTE — Consult Note (Addendum)
NAME:  Benjamin [name], MRN:  [id number], DOB:  [birth year], LOS: 0 ADMISSION DATE:  05/29/2019, CONSULTATION DATE:  12/24 REFERRING MD:  Dayna Barker, CHIEF COMPLAINT:, altered MS, SOB, DKA  Brief History   This is a 43 year old male admitted 12/24 w/ recent dx of COVID-19 (12/14). Presents 12/24 w/ cc: N/V, worsening shortness of breath and DKA  History of present illness   43 year old diabetic who was diagnosed w/ COVID-19 on 12/14 3 days after exposure to COVID+ patient. Was sent home w/ instructions to quarantine and treat symptomatically. Had actually been flagged as a candidate for monoclonal AB infusion but was a no-show to appointment. Presented to the ED at [facility] on 12/24 w/ cc: nausea, vomiting, intermittent fevers, 3-4 day h/o worsening shortness of breath and right flank pain.  Is unclear as to if he was taking his insulin during the time he was nauseated and vomiting In ER: HR 120s. RR high 20s, EBC 7.03, CO2 < 7, glucose 719, PCT < 0.1, lactate 3.8, CRP 2.2, ferritin 4957, LDH 195, ddimer 2.65. Beta-hydroxybuteric acid > 8, VBG 7.03, pco2 19.5, po2 46.6 bicarb 5 -Was given 1.5 liters saline -Insulin started.  -LA cleared from 3.8 to 2.7 -PCCM asked to eval   Past Medical History  DM (IDDM), ADHD, depression, HTN, HL   Significant Hospital Events   12/14 diagnosed in urgent care for DKA 12/24 admitted. Remdesivir started IVFs administered. IVFs administered. DKA protocol started CRP 2.2, ferritin 4957, LDH 195, ddimer 2.65.  Consults:  pccm  Procedures:   Significant Diagnostic Tests:    Micro Data:  BCX 2 12/24: >>>  Antimicrobials:  remdesivir 12/24>>>  Interim history/subjective:  Reports he is thirsty.  Short of breath.  Shortness of breath is about the same.  He is received about a liter and a half so far of saline  Objective   Blood pressure (Abnormal) 147/86, pulse (Abnormal) 126, temperature (Abnormal) 97.5 F (36.4 C), temperature source Axillary, resp. rate  (Abnormal) 25, weight 97.1 kg, SpO2 99 %.        Intake/Output Summary (Last 24 hours) at 05/29/2019 1551 Last data filed at 05/29/2019 1357 Gross per 24 hour  Intake 1500 ml  Output no documentation  Net 1500 ml   Filed Weights   05/29/19 1131  Weight: 97.1 kg    Examination: General: 43 year old white male currently appears critically ill tachypneic Marked work of breathing HENT: Mucous membranes are dry lips are cracked Neck veins flat sclera nonicteric Lungs: Faint crackles right lower lobe currently on room air saturating 95% Cardiovascular: Tachycardic regular rhythm without murmur rub or gallop Abdomen: Soft not tender Extremities: Cool mottled particularly over the lower and upper extremities pulses thready Neuro: Awake oriented speaking in 2-3 word phrases moves all extremities GU: Due to void  Resolved Hospital Problem list     Assessment & Plan:   Severe anion gap metabolic acidosis in the setting of diabetic ketoacidosis further complicated by lactic acidosis -Suspect lactic acidosis is being driven by work of breathing and volume depletion -Lactic acid has improved with initial fluid challenge Plan Admit to intensive care We will repeat fluid challenge, have ordered 2 L of LR Continue insulin drip with DKA protocol Serial chemistries awaiting follow-up chemistry now Hourly blood glucose checks Checking hemoglobin A1c Repeat lactic acid  COVID-19 infection With concern about right lower lobe Covid pneumonia -Currently not hypoxic but is demonstrating significant work of breathing.  Looking at his venous blood gas  I suspect this work of breathing is more metabolic in nature than pulmonary however he is at risk for worsening infiltrates with volume resuscitation Plan Starting remdesivir daily inflammatory markers and LFTs Pulse oximetry If he becomes hypoxic would need to initiate Decadron but would like to avoid this if able while in DKA Low molecular  weight heparin 40 mg twice daily  SIRS/severe sepsis in the setting of Covid infection Plan Continuing aggressive IV fluids Repeat lactic acid after 2 L of LR Admit to ICU  Best practice:  Diet: NPO Pain/Anxiety/Delirium protocol (if indicated): NA VAP protocol (if indicated): NA DVT prophylaxis: Luis Lopez LMWH GI prophylaxis:  Glucose control: DKA Mobility: OOB w assist  Code Status: full code  Family Communication: pending  Disposition:admit to ICU  Labs   CBC: Recent Labs  Lab 05/29/19 1133  WBC 17.1*  NEUTROABS 13.7*  HGB 19.2*  HCT 58.2*  MCV 88.3  PLT 451*    Basic Metabolic Panel: Recent Labs  Lab 05/29/19 1133  NA 131*  K 5.3*  CL 93*  CO2 <7*  GLUCOSE 719*  BUN 54*  CREATININE 2.31*  CALCIUM 9.6   GFR: Estimated Creatinine Clearance: 49.9 mL/min (A) (by C-G formula based on SCr of 2.31 mg/dL (H)). Recent Labs  Lab 05/29/19 1133 05/29/19 1136 05/29/19 1336  PROCALCITON <0.10  --   --   WBC 17.1*  --   --   LATICACIDVEN  --  3.8* 2.7*    Liver Function Tests: Recent Labs  Lab 05/29/19 1133  AST 16  ALT 22  ALKPHOS 95  BILITOT 2.0*  PROT 9.1*  ALBUMIN 3.9   No results for input(s): LIPASE, AMYLASE in the last 168 hours. No results for input(s): AMMONIA in the last 168 hours.  ABG    Component Value Date/Time   HCO3 5.0 (L) 05/29/2019 1325   ACIDBASEDEF 27.3 (H) 05/29/2019 1325   O2SAT 72.8 05/29/2019 1325     Coagulation Profile: No results for input(s): INR, PROTIME in the last 168 hours.  Cardiac Enzymes: No results for input(s): CKTOTAL, CKMB, CKMBINDEX, TROPONINI in the last 168 hours.  HbA1C: No results found for: HGBA1C  CBG: Recent Labs  Lab 05/29/19 1344 05/29/19 1424 05/29/19 1524  GLUCAP >600* >600* 561*    Review of Systems:   Not able   Past Medical History  He,  has a past medical history of ADD (attention deficit disorder) (02/23/2018) and Depression (02/23/2018).   Surgical History   History reviewed.  No pertinent surgical history.   Social History   reports that he has never smoked. He has never used smokeless tobacco. He reports previous alcohol use. He reports previous drug use. Drug: Marijuana.   Family History   His family history is not on file.   Allergies Allergies  Allergen Reactions  . Lisinopril     Other reaction(s): COUGH Other reaction(s): COUGH      Home Medications  Prior to Admission medications   Medication Sig Start Date End Date Taking? Authorizing Provider  atorvastatin (LIPITOR) 80 MG tablet Take 80 mg by mouth daily at 6 PM.  01/29/10  Yes [provider]  insulin lispro protamine-lispro (HUMALOG 75/25 MIX) (75-25) 100 UNIT/ML SUSP injection Inject 35-40 Units into the skin 3 (three) times daily.   Yes [provider]  lisinopril (ZESTRIL) 20 MG tablet Take 20 mg by mouth daily.  01/29/10  Yes [provider]  methylphenidate 27 MG PO CR tablet Take 1 tablet (27 mg  total) by mouth daily after breakfast. 02/04/19 05/29/19 Yes Chauncey MannJennings, Glenn E, MD  Continuous Blood Gluc Sensor (FREESTYLE LIBRE 14 DAY SENSOR) MISC CHANGE EVERY 14 DAYS 12/28/18   [provider]  methylphenidate 27 MG PO CR tablet Take 1 tablet (27 mg total) by mouth daily after breakfast. Patient not taking: Reported on 05/29/2019 03/06/19 04/05/19  Chauncey MannJennings, Glenn E, MD  methylphenidate 27 MG PO CR tablet Take 1 tablet (27 mg total) by mouth daily after breakfast. Patient not taking: Reported on 05/29/2019 04/05/19 05/05/19  Chauncey MannJennings, Glenn E, MD     Critical care time: 35 minutes     Simonne MartinetPeter E Darden Flemister ACNP-BC [facility] Pulmonary/Critical Care Pager # [phone] OR # [phone] if no answer   ,

## 2019-05-29 NOTE — ED Notes (Signed)
Per Critical Care. LR bolus is priority. This Probation officer only has two IV access sites. This Probation officer will start Remdesivir infusion after LR bolus.

## 2019-05-29 NOTE — ED Triage Notes (Signed)
Patient arrived by self from home. Pt c/o SOB and RT side back pain.   Patient stated they were diagnosed with COVID-19 11 days ago.   Pt rates back pain 10/10.

## 2019-05-29 NOTE — Progress Notes (Signed)
RN paged NP regarding elevated Bps.  Home med shows lisinopril, which is also listed as an allergy.  Patient verbally confirmed over the phone with RN in the room, that he uses lisinopril daily and it is also listed as his daily meds.

## 2019-05-29 NOTE — H&P (Signed)
History and Physical    Benjamin HuaBrian S Mcclure BJY:[id number] DOB: 10/01/1975 DOA: 05/29/2019  PCP: [name], [name], MD   Patient coming from:  home  Chief Complaint: Shortness of breath  HPI: Benjamin [name] is a 43 y.o. male with medical history significant for diabetes mellitus on insulin, ADHD, depression, hypertension, hyperlipidemia recently diagnosed Covid on 05/19/2019 comes to the ER for worsening multiple complaints.  Initially had intermittent fevers, body aches, not productive cough, nausea continue to get worse and past 3 to 4 days has developed shortness of breath, difficulty catching a full breath, right flank pain constant.  Has been having increased nausea and vomiting of multiple nonbloody nonbilious emesis. He was initially being planned for outpatient infusion forCasirivimab/Imdevimab but since patient is feeling so sick he was came to the ER instead. When I saw the patient is breathing in mid 20s still tachycardic, he appears drowsy but able to wake up and interact some. Currently denies any chest pain, flank pain "so so". He reports nausea vomiting and poor oral intake for few days but history is limited given patient is drowsy. Wife reports she also has Covid and not feeling well and is not sure how long he has not been taking his insulin.  ED Course:Blood pressure 150s 160s systolic, heart rate in 120s, tachypneic and RR 20-27, saturating 98-99% on room air, chest x-ray no acute finding, lab work significant for WBC 7.03, PCO2 19, sodium 139, potassium 5.3, CO2 less than 7, blood glucose 719, BUN/creatinine 54/2.3, procalcitonin less than 0.1, lactic acid 3.8, CRP 2.2, ferritin 4967, LDH 195, ferritin 719, fibrinogen more than 800, D-dimer pending, culture were sent. s/p 1.5 L normal saline bolus lactate down trended to 2.7, also started on  insulin infusion.  The chest was ordered but due to significant AKI cancelled.  He is being admitted for further management.  Review of System:All  systems were reviewed and were negative except as mentioned in HPI above. Negative for fever/hemoptysis  Past Medical History:  Diagnosis Date  . ADD (attention deficit disorder) 02/23/2018  . Depression 02/23/2018    History reviewed. No pertinent surgical history.   reports that he has never smoked. He has never used smokeless tobacco. He reports previous alcohol use. He reports previous drug use. Drug: Marijuana.  Allergies  Allergen Reactions  . Lisinopril     Other reaction(s): COUGH Other reaction(s): COUGH     History reviewed. No pertinent family history.   Prior to Admission medications   Medication Sig Start Date End Date Taking? Authorizing Provider  atorvastatin (LIPITOR) 80 MG tablet Take 80 mg by mouth daily at 6 PM.  01/29/10  Yes [provider]  insulin lispro protamine-lispro (HUMALOG 75/25 MIX) (75-25) 100 UNIT/ML SUSP injection Inject 35-40 Units into the skin 3 (three) times daily.   Yes [provider]  lisinopril (ZESTRIL) 20 MG tablet Take 20 mg by mouth daily.  01/29/10  Yes [provider]  methylphenidate 27 MG PO CR tablet Take 1 tablet (27 mg total) by mouth daily after breakfast. 02/04/19 05/29/19 Yes Chauncey MannJennings, Glenn E, MD  Continuous Blood Gluc Sensor (FREESTYLE LIBRE 14 DAY SENSOR) MISC CHANGE EVERY 14 DAYS 12/28/18   [provider]  methylphenidate 27 MG PO CR tablet Take 1 tablet (27 mg total) by mouth daily after breakfast. Patient not taking: Reported on 05/29/2019 03/06/19 04/05/19  Chauncey MannJennings, Glenn E, MD  methylphenidate 27 MG PO CR tablet Take 1 tablet (27 mg total) by mouth daily after  breakfast. Patient not taking: Reported on 05/29/2019 04/05/19 05/05/19  Chauncey Mann, MD    Physical Exam: Vitals:   05/29/19 1230 05/29/19 1330 05/29/19 1400 05/29/19 1500  BP: (!) 164/97 (!) 157/97 (!) 160/94 (!) 147/86  Pulse: (!) 120 (!) 120 (!) 120 (!) 126  Resp: (!) 27 (!) 25 (!) 23 (!) 25  Temp:      TempSrc:       SpO2: 99% 99% 99% 99%  Weight:        General exam: Patient is drowsy, taking deep breath, on room air, in moderate distress,average built. HEENT:Oral mucosa DRY, Ear/Nose WNL grossly, dentition normal. Respiratory system: Bilateral equal air entry and taking deep breath, lung sounds are clear without wheezing or crackles,non tender on palpation. Cardiovascular system: regular rate and rhythm, tachycardic, S1 & S2 heard, No JVD/murmurs. Gastrointestinal system: Abdomen soft, non-tender, non-distended, BS +.  Nervous System: drowsy but able to wake up and interact, alert, awake, in distress, able to interact.Able to move UE and LE, sensation intact. Extremities: No edema, distal peripheral pulses palpable and faint.  Skin: No rashes,no icterus.  His feet are cold. MSK: Normal muscle bulk,tone, power  Labs on Admission: I have personally reviewed following labs and imaging studies  CBC: Recent Labs  Lab 05/29/19 1133  WBC 17.1*  NEUTROABS 13.7*  HGB 19.2*  HCT 58.2*  MCV 88.3  PLT 451*   Basic Metabolic Panel: Recent Labs  Lab 05/29/19 1133  NA 131*  K 5.3*  CL 93*  CO2 <7*  GLUCOSE 719*  BUN 54*  CREATININE 2.31*  CALCIUM 9.6   GFR: Estimated Creatinine Clearance: 49.9 mL/min (A) (by C-G formula based on SCr of 2.31 mg/dL (H)). Liver Function Tests: Recent Labs  Lab 05/29/19 1133  AST 16  ALT 22  ALKPHOS 95  BILITOT 2.0*  PROT 9.1*  ALBUMIN 3.9   No results for input(s): LIPASE, AMYLASE in the last 168 hours. No results for input(s): AMMONIA in the last 168 hours. Coagulation Profile: No results for input(s): INR, PROTIME in the last 168 hours. Cardiac Enzymes: No results for input(s): CKTOTAL, CKMB, CKMBINDEX, TROPONINI in the last 168 hours. BNP (last 3 results) No results for input(s): PROBNP in the last 8760 hours. HbA1C: No results for input(s): HGBA1C in the last 72 hours. CBG: Recent Labs  Lab 05/29/19 1344 05/29/19 1424 05/29/19 1524    GLUCAP >600* >600* 561*   Lipid Profile: Recent Labs    05/29/19 1136  TRIG 787*   Thyroid Function Tests: No results for input(s): TSH, T4TOTAL, FREET4, T3FREE, THYROIDAB in the last 72 hours. Anemia Panel: Recent Labs    05/29/19 1136  FERRITIN 4,967*   Urine analysis: No results found for: COLORURINE, APPEARANCEUR, LABSPEC, PHURINE, GLUCOSEU, HGBUR, BILIRUBINUR, KETONESUR, PROTEINUR, UROBILINOGEN, NITRITE, LEUKOCYTESUR  Radiological Exams on Admission: DG Chest Port 1 View  Result Date: 05/29/2019 CLINICAL DATA:  Weakness. COVID-19 positive with worsening symptoms. EXAM: PORTABLE CHEST 1 VIEW COMPARISON:  None. FINDINGS: The heart size and mediastinal contours are within normal limits. Both lungs are clear. The visualized skeletal structures are unremarkable. IMPRESSION: Normal examination. Electronically Signed   By: Beckie Salts M.D.   On: 05/29/2019 12:41    Assessment/Plan  [id number] reports he takes NPH insulin 25 units 3 times a day.  In the setting of COVID-19 infection patient currently in DKA, pH 7.03 in VBG, bicarb less than 7, blood sugar 719, received 1.5 L bolus normal saline and getting another 1 L  bolus NOW. Still appears dehydrated, will consider additional 1-1.5 litre bolus additionally.We will continue him on insulin protocol along with IV fluid hydration and serial BMP, serial beta hydroxybutyrate and repeat VBG.Check hba1c. Monitor his fluid status closely to avoid fluid overload given his COVID-19 infection. Keep NPO. Patient is very drowsy and also is acidotic currently vitals are stable blood pressure hypotensive tachypneic and tachycardic given his overall clinical situation I am going to consult critical care Dr Carlis Abbott.  COVID-19 infection diagnosed outpatient on 12/14.Patient currently not hypoxic no need for steroid.  Given he is being hospitalized, appears to be in respiratory distress and severe DKA we will consult pharmacy for remdesivir, given he  has possible infiltrates on the right side.We will place him on Lovenox for DVT prophylaxis.  Holding Decadron as he is not hypoxic. Recent Labs    05/29/19 1133 05/29/19 1136  DDIMER 2.65*  --   FERRITIN  --  4,967*  LDH 195*  --   CRP  --  2.2*   Nausea vomiting/poor oral intake/deconditioning: In the setting of COVID-19 infection. Continue aggressive IV fluid hydration as above. Continue supportive care, as needed Zofran.  AKI BUN/creatinine 54/2.3 in the setting of Covid infection, [id number] reports not being able to drink or eat well.Appears very dry we will continue on aggressive IV fluid hydration and caution with fluid overload due to the COVID-19.   Reported rt sided flank/chest pain chest x-ray reported negative, possible right-sided infiltrates likely masked from severe dehydration.Currently patient denies any chest pain.He appears to be acidotic from DKA and having Kussmaul breathing.We will hold off on any IV contrast studies given his AKI, if continues to complain of chest pain can consider CTA chest, will put him on Lovenox for DVT prophylaxis as above and monitor closely. he's tachypneic but on room air.  Lactic acidosis:from DKA.Trending down we will monitor serial lactic acid.  Procalcitonin is reassuring.  Suspect from  DKA/volume depletion.   Hypothermia: Temperature 97.5, likely multifactorial with DKA.  Continue aggressive fluid hydration monitor closely in ICU  Elevated Total Billi, but normal AST ALT likely with DKA dehydration.  Monitor LFTs.  Pseudohyponatremia/mild hyperkalemia due to DKA hyperglycemia.Monitor BMP.  Patient is currently critically ill,I discussed with intensivist Dr Carlis Abbott.At this time will admit the patient under ICU service-Dr. Carlis Abbott will take over. Further plan of care as per ICU.  No results found for: [id number] Body mass index is 29.44 kg/m.   Severity of Illness:  I certify that at the point of admission it is my clinical judgment  that the patient will require inpatient hospital care spanning beyond 2 midnights from the point of admission due to high intensity of service, high risk for further deterioration and high frequency of surveillance required due to DKA COVID-19 infection, renal failure.   DVT prophylaxis:SCD/Lovenox. Code Status: Full code. Family Communication:Admission, patients condition and plan of care including tests being ordered have been discussed with the patient and significant other who indicate understanding and agree with the plan and Code Status. I called his wife and updated and answered all his questions.  Consults called: PCCM.   Antonieta Pert MD [facility] Pager [phone]  If 7PM-7AM, please contact night-coverage [email or web address]  05/29/2019, 3:37 PM

## 2019-05-29 NOTE — ED Provider Notes (Signed)
[facility]-EMERGENCY DEPT Provider Note   CSN: [id number] Arrival date & time: 05/29/19  1101     History Chief Complaint  Patient presents with  . Shortness of Breath  . Back Pain    Benjamin [name] is a 43 y.o. male history of diabetes, ADHD, depression.  Patient presents today for shortness of breath and right flank pain.  He reports his Covid positive diagnosed 10 days ago and he is on his 11th day of illness.  He reports that he had initially developed body aches, intermittent fevers, nonproductive cough as well as nausea.  Symptoms have continued since onset and over the past 3-4 days he developed shortness of breath.  He describes shortness of breath as difficulty catching a full breath without chest pain.  He reports that he developed right flank pain as well around the same time he developed shortness of breath he did describes a sharp sensation in his right flank constant no clear aggravating or relieving factors, nonradiating.  He reports that over the past 3-4 days he has developed increased nausea and has had multiple episodes of nonbloody/nonbilious emesis.  Denies headache, vision changes, neck stiffness, chest pain, hemoptysis, history of blood clot, abdominal pain, diarrhea, dysuria/hematuria, extremity swelling/color change, fall/injury or any additional concerns.   Of note patient had planned infusion of Casirivimab/Imdevimab as outpatient today but due to feeling unwell he presented to this ER instead. HPI     Past Medical History:  Diagnosis Date  . ADD (attention deficit disorder) 02/23/2018  . Depression 02/23/2018    Patient Active Problem List   Diagnosis Date Noted  . Attention deficit hyperactivity disorder (ADHD), combined type, moderate 02/23/2018  . Persistent depressive disorder with atypical features, currently mild 02/23/2018    History reviewed. No pertinent surgical history.     History reviewed. No pertinent family  history.  Social History   Tobacco Use  . Smoking status: Never Smoker  . Smokeless tobacco: Never Used  Substance Use Topics  . Alcohol use: Not Currently  . Drug use: Not Currently    Types: Marijuana    Home Medications Prior to Admission medications   Medication Sig Start Date End Date Taking? Authorizing Provider  atorvastatin (LIPITOR) 10 MG tablet Take by mouth. 01/29/10   [provider]  Continuous Blood Gluc Sensor (FREESTYLE LIBRE 14 DAY SENSOR) MISC CHANGE EVERY 14 DAYS 12/28/18   [provider]  empagliflozin (JARDIANCE) 10 MG TABS tablet Take by mouth.    [provider]  fenofibrate micronized (LOFIBRA) 134 MG capsule 1 mg. 01/29/10   [provider]  fluticasone (FLONASE) 50 MCG/ACT nasal spray 1 spray by Each Nare route daily. 03/10/16   [provider]  lisdexamfetamine (VYVANSE) 30 MG capsule Take by mouth.    [provider]  lisinopril (ZESTRIL) 10 MG tablet Frequency:   Dosage:0   MG  Instructions:  Note:T 1 tab po qd for 2 weeks; then T 2 tabs qd. 01/29/10   [provider]  metFORMIN (GLUCOPHAGE) 500 MG tablet Frequency:   Dosage:0   MG  Instructions:  Note:Take this med w/meals: 1 tab po bid for 1 week; then increase to 2 tabs in am, 1 tab in pm, for 1 week; then increase to 2 tabs bid. 01/29/10   [provider]  methylphenidate 27 MG PO CR tablet Take 1 tablet (27 mg total) by mouth daily after breakfast. 02/04/19 03/06/19  Chauncey Mann, MD  methylphenidate 27 MG  PO CR tablet Take 1 tablet (27 mg total) by mouth daily after breakfast. 03/06/19 04/05/19  Chauncey MannJennings, Glenn E, MD  methylphenidate 27 MG PO CR tablet Take 1 tablet (27 mg total) by mouth daily after breakfast. 04/05/19 05/05/19  Chauncey MannJennings, Glenn E, MD  omega-3 acid ethyl esters (LOVAZA) 1 g capsule Frequency:   Dosage:0   GM  Instructions:  Note:TAKE 1 CAPSULE 4 TIMES DAILY 05/08/10   [provider]    Allergies    Patient  has no known allergies.  Review of Systems   Review of Systems Ten systems are reviewed and are negative for acute change except as noted in the HPI  Physical Exam Updated Vital Signs BP (!) 164/97   Pulse (!) 120   Temp (!) 97.5 F (36.4 C) (Axillary)   Resp (!) 27   Wt 97.1 kg   SpO2 99%   BMI 29.44 kg/m   Physical Exam Constitutional:      General: He is not in acute distress.    Appearance: Normal appearance. He is well-developed. He is ill-appearing. He is not diaphoretic.  HENT:     Head: Normocephalic and atraumatic.     Right Ear: External ear normal.     Left Ear: External ear normal.     Nose: Nose normal.  Eyes:     General: Vision grossly intact. Gaze aligned appropriately.     Pupils: Pupils are equal, round, and reactive to light.  Neck:     Trachea: Trachea and phonation normal. No tracheal deviation.  Cardiovascular:     Rate and Rhythm: Regular rhythm. Tachycardia present.  Pulmonary:     Effort: Pulmonary effort is normal. Tachypnea present. No respiratory distress.     Breath sounds: Normal breath sounds.  Abdominal:     General: There is no distension.     Palpations: Abdomen is soft.     Tenderness: There is no abdominal tenderness. There is no guarding or rebound.  Musculoskeletal:        General: Normal range of motion.     Cervical back: Normal range of motion.     Right lower leg: No tenderness. No edema.     Left lower leg: No tenderness. No edema.  Skin:    General: Skin is warm and dry.  Neurological:     Mental Status: He is alert.     GCS: GCS eye subscore is 4. GCS verbal subscore is 5. GCS motor subscore is 6.     Comments: Speech is clear and goal oriented, follows commands Major Cranial nerves without deficit, no facial droop Moves extremities without ataxia, coordination intact  Psychiatric:        Behavior: Behavior normal.     ED Results / Procedures / Treatments   Labs (all labs ordered are listed, but only abnormal  results are displayed) Labs Reviewed  CBC WITH DIFFERENTIAL/PLATELET - Abnormal; Notable for the following components:      Result Value   WBC 17.1 (*)    RBC 6.59 (*)    Hemoglobin 19.2 (*)    HCT 58.2 (*)    Platelets 451 (*)    Neutro Abs 13.7 (*)    Monocytes Absolute 1.1 (*)    Abs Immature Granulocytes 0.39 (*)    All other components within normal limits  COMPREHENSIVE METABOLIC PANEL - Abnormal; Notable for the following components:   Sodium 131 (*)    Potassium 5.3 (*)    Chloride 93 (*)  CO2 <7 (*)    Glucose, Bld 719 (*)    BUN 54 (*)    Creatinine, Ser 2.31 (*)    Total Protein 9.1 (*)    Total Bilirubin 2.0 (*)    GFR calc non Af Amer 34 (*)    GFR calc Af Amer 39 (*)    All other components within normal limits  LACTIC ACID, PLASMA - Abnormal; Notable for the following components:   Lactic Acid, Venous 3.8 (*)    All other components within normal limits  LACTATE DEHYDROGENASE - Abnormal; Notable for the following components:   LDH 195 (*)    All other components within normal limits  TRIGLYCERIDES - Abnormal; Notable for the following components:   Triglycerides 787 (*)    All other components within normal limits  FIBRINOGEN - Abnormal; Notable for the following components:   Fibrinogen >800 (*)    All other components within normal limits  CULTURE, BLOOD (ROUTINE X 2)  CULTURE, BLOOD (ROUTINE X 2)  LACTIC ACID, PLASMA  D-DIMER, QUANTITATIVE (NOT AT [facility])  PROCALCITONIN  FERRITIN  C-REACTIVE PROTEIN  URINALYSIS, ROUTINE W REFLEX MICROSCOPIC  BETA-HYDROXYBUTYRIC ACID  BETA-HYDROXYBUTYRIC ACID  BLOOD GAS, VENOUS  CBG MONITORING, ED    EKG None  Radiology DG Chest Port 1 View  Result Date: 05/29/2019 CLINICAL DATA:  Weakness. COVID-19 positive with worsening symptoms. EXAM: PORTABLE CHEST 1 VIEW COMPARISON:  None. FINDINGS: The heart size and mediastinal contours are within normal limits. Both lungs are clear. The visualized skeletal  structures are unremarkable. IMPRESSION: Normal examination. Electronically Signed   By: Beckie Salts M.D.   On: 05/29/2019 12:41    Procedures .Critical Care Performed by: Bill Salinas, PA-C Authorized by: Bill Salinas, PA-C   Critical care provider statement:    Critical care time (minutes):  46   Critical care was necessary to treat or prevent imminent or life-threatening deterioration of the following conditions:  Metabolic crisis (DKA)   Critical care was time spent personally by me on the following activities:  Discussions with consultants, evaluation of patient's response to treatment, examination of patient, ordering and performing treatments and interventions, ordering and review of laboratory studies, ordering and review of radiographic studies, pulse oximetry, re-evaluation of patient's condition, obtaining history from patient or surrogate, review of old charts and development of treatment plan with patient or surrogate   (including critical care time)  Medications Ordered in ED Medications  sodium chloride (PF) 0.9 % injection (has no administration in time range)  insulin regular, human (MYXREDLIN) 100 units/ 100 mL infusion (has no administration in time range)  dextrose 5 %-0.45 % sodium chloride infusion (has no administration in time range)  dextrose 50 % solution 0-50 mL (has no administration in time range)  0.9 %  sodium chloride infusion (has no administration in time range)  sodium chloride 0.9 % bolus 1,000 mL (1,000 mLs Intravenous New Bag/Given 05/29/19 1153)  sodium chloride 0.9 % bolus 500 mL (500 mLs Intravenous New Bag/Given 05/29/19 1156)  LORazepam (ATIVAN) injection 0.5 mg (0.5 mg Intravenous Given 05/29/19 1153)    ED Course  I have reviewed the triage vital signs and the nursing notes.  Pertinent labs & imaging results that were available during my care of the patient were reviewed by me and considered in my medical decision making (see  chart for details).  Clinical Course as of May 29 1451  Thu May 29, 2019  1141 Dr. Molli Knock    [BM]  [place]   [BM]    Clinical Course User Index [BM] Gari Crown   MDM Rules/Calculators/A&P                      43 year old male history significant for diabetes presents as Covid positive on his 11th day of illness.  Endorsing shortness of breath, right flank pain, nausea and vomiting.  He is tachycardic and tachypneic on arrival.  SPO2 100% on room air.  He is ill-appearing and uncomfortable.  Covid admission order set utilized.  CT angio PE study ordered due to concern of possible pulmonary embolism in the setting of COVID-19 virus as cause of right flank pain and shortness of breath.  Case discussed with Dr. Roderic Palau, will consult critical care regarding possible confusion as scheduled today. - Discussed with critical care Dr. Nelda Marseille who advises to speak with Dr. [place] Bells.  Consult placed by nurse secretary. - CBC shows leukocytosis of 17.1 with left shift CMP shows glucose 719, CO2 less than 7, creatinine 2.31, anion gap greater than 30. Lactic 3.5 LDH 195 Fibrinogen greater than 800 Triglycerides 787 CXR:    IMPRESSION:  Normal examination.   - No prior labs to compare.  It appears patient is in DKA today, DKA order set utilized.  Plan to admit to hospitalist service for further management.  Additionally due to AKI cannot obtain CT angiogram pulmonary embolism study, plan to discuss with hospitalist.  Consult placed to hospitalist for admission. - On reevaluation patient remains tachycardic, mildly tachypneic, no acute distress. - Discussed case with hospitalist Dr. Maren Beach will be seeing patient for admission.  Discussed AKI and inability to obtain CT angiogram today, hospitalist aware, plan for possible angiography tomorrow. Likely shortness of breath tachypnea tachycardia is secondary to patient's DKA today, lower suspicion for pulmonary embolism at this  time.   Benjamin [name] was evaluated in Emergency Department on 05/29/2019 for the symptoms described in the history of present illness. He was evaluated in the context of the global COVID-19 pandemic, which necessitated consideration that the patient might be at risk for infection with the SARS-CoV-2 virus that causes COVID-19. Institutional protocols and algorithms that pertain to the evaluation of patients at risk for COVID-19 are in a state of rapid change based on information released by regulatory bodies including the CDC and federal and state organizations. These policies and algorithms were followed during the patient's care in the ED.  Note: Portions of this report may have been transcribed using voice recognition software. Every effort was made to ensure accuracy; however, inadvertent computerized transcription errors may still be present. Final Clinical Impression(s) / ED Diagnoses Final diagnoses:  COVID-19 virus infection  Diabetic ketoacidosis without coma associated with type 2 diabetes mellitus [facility])    Rx / DC Orders ED Discharge Orders    None       Gari Crown 05/29/19 1458    Milton Ferguson, MD 05/29/19 [phone]

## 2019-05-30 LAB — BASIC METABOLIC PANEL
Anion gap: 22 — ABNORMAL HIGH (ref 5–15)
Anion gap: 15 (ref 5–15)
Anion gap: 10 (ref 5–15)
Anion gap: 9 (ref 5–15)
Anion gap: 6 (ref 5–15)
BUN: 45 mg/dL — ABNORMAL HIGH (ref 6–20)
BUN: 38 mg/dL — ABNORMAL HIGH (ref 6–20)
BUN: 31 mg/dL — ABNORMAL HIGH (ref 6–20)
BUN: 24 mg/dL — ABNORMAL HIGH (ref 6–20)
BUN: 21 mg/dL — ABNORMAL HIGH (ref 6–20)
CO2: 9 mmol/L — ABNORMAL LOW (ref 22–32)
CO2: 14 mmol/L — ABNORMAL LOW (ref 22–32)
CO2: 18 mmol/L — ABNORMAL LOW (ref 22–32)
CO2: 18 mmol/L — ABNORMAL LOW (ref 22–32)
CO2: 21 mmol/L — ABNORMAL LOW (ref 22–32)
Calcium: 8.8 mg/dL — ABNORMAL LOW (ref 8.9–10.3)
Calcium: 8.6 mg/dL — ABNORMAL LOW (ref 8.9–10.3)
Calcium: 8.6 mg/dL — ABNORMAL LOW (ref 8.9–10.3)
Calcium: 8.4 mg/dL — ABNORMAL LOW (ref 8.9–10.3)
Calcium: 8.4 mg/dL — ABNORMAL LOW (ref 8.9–10.3)
Chloride: 113 mmol/L — ABNORMAL HIGH (ref 98–111)
Chloride: 111 mmol/L (ref 98–111)
Chloride: 116 mmol/L — ABNORMAL HIGH (ref 98–111)
Chloride: 114 mmol/L — ABNORMAL HIGH (ref 98–111)
Chloride: 114 mmol/L — ABNORMAL HIGH (ref 98–111)
Creatinine, Ser: 1.53 mg/dL — ABNORMAL HIGH (ref 0.61–1.24)
Creatinine, Ser: 1.32 mg/dL — ABNORMAL HIGH (ref 0.61–1.24)
Creatinine, Ser: 1.07 mg/dL (ref 0.61–1.24)
Creatinine, Ser: 0.96 mg/dL (ref 0.61–1.24)
Creatinine, Ser: 0.97 mg/dL (ref 0.61–1.24)
GFR calc Af Amer: >60 mL/min (ref >60)
GFR calc Af Amer: >60 mL/min (ref >60)
GFR calc Af Amer: >60 mL/min (ref >60)
GFR calc Af Amer: >60 mL/min (ref >60)
GFR calc Af Amer: >60 mL/min (ref >60)
GFR calc non Af Amer: 55 mL/min — ABNORMAL LOW (ref >60)
GFR calc non Af Amer: >60 mL/min (ref >60)
GFR calc non Af Amer: >60 mL/min (ref >60)
GFR calc non Af Amer: >60 mL/min (ref >60)
GFR calc non Af Amer: >60 mL/min (ref >60)
Glucose, Bld: 241 mg/dL — ABNORMAL HIGH (ref 70–99)
Glucose, Bld: 180 mg/dL — ABNORMAL HIGH (ref 70–99)
Glucose, Bld: 152 mg/dL — ABNORMAL HIGH (ref 70–99)
Glucose, Bld: 180 mg/dL — ABNORMAL HIGH (ref 70–99)
Glucose, Bld: 176 mg/dL — ABNORMAL HIGH (ref 70–99)
Potassium: 4.3 mmol/L (ref 3.5–5.1)
Potassium: 4 mmol/L (ref 3.5–5.1)
Potassium: 3.3 mmol/L — ABNORMAL LOW (ref 3.5–5.1)
Potassium: 3.6 mmol/L (ref 3.5–5.1)
Potassium: 4.5 mmol/L (ref 3.5–5.1)
Sodium: 144 mmol/L (ref 135–145)
Sodium: 140 mmol/L (ref 135–145)
Sodium: 144 mmol/L (ref 135–145)
Sodium: 141 mmol/L (ref 135–145)
Sodium: 141 mmol/L (ref 135–145)

## 2019-05-30 LAB — FERRITIN: Ferritin: 2209 ng/mL — ABNORMAL HIGH (ref 24–336)

## 2019-05-30 LAB — HEPATIC FUNCTION PANEL
ALT: 15 U/L (ref 0–44)
AST: 12 U/L — ABNORMAL LOW (ref 15–41)
Albumin: 2.8 g/dL — ABNORMAL LOW (ref 3.5–5.0)
Alkaline Phosphatase: 64 U/L (ref 38–126)
Bilirubin, Direct: 0.1 mg/dL (ref 0.0–0.2)
Indirect Bilirubin: 0.6 mg/dL (ref 0.3–0.9)
Total Bilirubin: 0.7 mg/dL (ref 0.3–1.2)
Total Protein: 6.1 g/dL — ABNORMAL LOW (ref 6.5–8.1)

## 2019-05-30 LAB — GLUCOSE, CAPILLARY
Glucose-Capillary: 158 mg/dL — ABNORMAL HIGH (ref 70–99)
Glucose-Capillary: 167 mg/dL — ABNORMAL HIGH (ref 70–99)
Glucose-Capillary: 168 mg/dL — ABNORMAL HIGH (ref 70–99)
Glucose-Capillary: 119 mg/dL — ABNORMAL HIGH (ref 70–99)
Glucose-Capillary: 154 mg/dL — ABNORMAL HIGH (ref 70–99)
Glucose-Capillary: 155 mg/dL — ABNORMAL HIGH (ref 70–99)
Glucose-Capillary: 162 mg/dL — ABNORMAL HIGH (ref 70–99)
Glucose-Capillary: 166 mg/dL — ABNORMAL HIGH (ref 70–99)
Glucose-Capillary: 171 mg/dL — ABNORMAL HIGH (ref 70–99)
Glucose-Capillary: 173 mg/dL — ABNORMAL HIGH (ref 70–99)
Glucose-Capillary: 181 mg/dL — ABNORMAL HIGH (ref 70–99)
Glucose-Capillary: 183 mg/dL — ABNORMAL HIGH (ref 70–99)
Glucose-Capillary: 203 mg/dL — ABNORMAL HIGH (ref 70–99)

## 2019-05-30 LAB — BETA-HYDROXYBUTYRIC ACID
Beta-Hydroxybutyric Acid: 2.69 mmol/L — ABNORMAL HIGH (ref 0.05–0.27)
Beta-Hydroxybutyric Acid: 2.69 mmol/L — ABNORMAL HIGH (ref 0.05–0.27)

## 2019-05-30 LAB — PHOSPHORUS: Phosphorus: 2.6 mg/dL (ref 2.5–4.6)

## 2019-05-30 LAB — HEMOGLOBIN A1C
Hgb A1c MFr Bld: 13.1 % — ABNORMAL HIGH (ref 4.8–5.6)
Mean Plasma Glucose: 329.27 mg/dL

## 2019-05-30 LAB — MAGNESIUM
Magnesium: 2.4 mg/dL (ref 1.7–2.4)
Magnesium: 2.5 mg/dL — ABNORMAL HIGH (ref 1.7–2.4)

## 2019-05-30 LAB — D-DIMER, QUANTITATIVE: D-Dimer, Quant: 1.92 ug/mL-FEU — ABNORMAL HIGH (ref 0.00–0.50)

## 2019-05-30 LAB — C-REACTIVE PROTEIN: CRP: 1.7 mg/dL — ABNORMAL HIGH (ref <1.0)

## 2019-05-30 MED ORDER — CHLORHEXIDINE GLUCONATE CLOTH 2 % EX PADS
6.0000 | MEDICATED_PAD | Freq: Every day | CUTANEOUS | Status: DC
  Administered 2019-05-30 – 2019-06-03 (×5): 6 via TOPICAL

## 2019-05-30 MED ORDER — HYDRALAZINE HCL 25 MG PO TABS
25.0000 mg | ORAL_TABLET | Freq: Three times a day (TID) | ORAL | Status: AC | PRN
  Administered 2019-05-31: 25 mg via ORAL
  Filled 2019-05-30 (×2): qty 1

## 2019-05-30 MED ORDER — LABETALOL HCL 5 MG/ML IV SOLN
10.0000 mg | INTRAVENOUS | Status: DC | PRN
  Administered 2019-05-30: 10 mg via INTRAVENOUS
  Administered 2019-05-30 – 2019-05-31 (×3): 20 mg via INTRAVENOUS
  Filled 2019-05-30 (×4): qty 4

## 2019-05-30 MED ORDER — ONDANSETRON HCL 4 MG/2ML IJ SOLN
4.0000 mg | Freq: Three times a day (TID) | INTRAMUSCULAR | Status: DC | PRN
  Administered 2019-05-30 – 2019-05-31 (×3): 4 mg via INTRAVENOUS
  Filled 2019-05-30 (×4): qty 2

## 2019-05-30 MED ORDER — POTASSIUM CHLORIDE CRYS ER 20 MEQ PO TBCR
40.0000 meq | EXTENDED_RELEASE_TABLET | ORAL | Status: AC
  Administered 2019-05-30 (×2): 40 meq via ORAL
  Filled 2019-05-30 (×2): qty 2

## 2019-05-30 MED ORDER — POTASSIUM CHLORIDE 10 MEQ/100ML IV SOLN
10.0000 meq | INTRAVENOUS | Status: AC
  Administered 2019-05-30 (×4): 10 meq via INTRAVENOUS
  Filled 2019-05-30 (×4): qty 100

## 2019-05-30 NOTE — Progress Notes (Signed)
EndoTool prompts suggest that criteria is met and to transition patient. Hospitalist informed of EndoTool Prompt and that gap is not closed, verbal orders to continue insulin drip. 

## 2019-05-30 NOTE — Progress Notes (Signed)
eLink Physician-Brief Progress Note Patient Name: Benjamin [name] DOB: 01-Jan-1976 MRN: [id number]   Date of Service  05/30/2019  HPI/Events of Note  Uncontrolled hypertension  eICU Interventions  Labetalol 10-20 mg iv Q 2 hours PRN SBP > 150 mmHg.        Benjamin [name] Benjamin [name] 05/30/2019, 9:09 PM

## 2019-05-30 NOTE — Progress Notes (Signed)
NAME:  Benjamin [name], MRN:  [id number], DOB:  Feb 14, 1976, LOS: 1 ADMISSION DATE:  05/29/2019, CONSULTATION DATE:  12/24 REFERRING MD:  Dayna Barker, CHIEF COMPLAINT:, altered MS, SOB, DKA  Brief History   This is a 43 year old male admitted 12/24 w/ recent dx of COVID-19 (12/14). Presents 12/24 w/ cc: N/V, worsening shortness of breath and DKA  History of present illness   43 year old diabetic who was diagnosed w/ COVID-19 on 12/14 3 days after exposure to COVID+ patient. Was sent home w/ instructions to quarantine and treat symptomatically. Had actually been flagged as a candidate for monoclonal AB infusion but was a no-show to appointment. Presented to the ED at [facility] on 12/24 w/ cc: nausea, vomiting, intermittent fevers, 3-4 day h/o worsening shortness of breath and right flank pain.  Is unclear as to if he was taking his insulin during the time he was nauseated and vomiting In ER: HR 120s. RR high 20s, EBC 7.03, CO2 < 7, glucose 719, PCT < 0.1, lactate 3.8, CRP 2.2, ferritin 4957, LDH 195, ddimer 2.65. Beta-hydroxybuteric acid > 8, VBG 7.03, pco2 19.5, po2 46.6 bicarb 5 -Was given 1.5 liters saline -Insulin started.  -LA cleared from 3.8 to 2.7 -PCCM asked to eval   Past Medical History  DM (IDDM), ADHD, depression, HTN, HL   Significant Hospital Events   12/14 diagnosed in urgent care for DKA 12/24 admitted. Remdesivir started IVFs administered. IVFs administered. DKA protocol started CRP 2.2, ferritin 4957, LDH 195, ddimer 2.65.  Consults:  pccm  Procedures:   Significant Diagnostic Tests:    Micro Data:  BCX 2 12/24: >>>  Antimicrobials:  remdesivir 12/24>>>  Interim history/subjective:  Overall still feeling poorly, but improved since yesterday.  No shortness of breath, nausea, vomiting, diarrhea.  Objective   Blood pressure 136/88, pulse (!) 111, temperature (!) 97.5 F (36.4 C), temperature source Axillary, resp. rate (!) 0, height 6\' 1"  (1.854 m), weight 97.1 kg, SpO2 98  %.        Intake/Output Summary (Last 24 hours) at 05/30/2019 1231 Last data filed at 05/30/2019 1000 Gross per 24 hour  Intake 4728.98 ml  Output 1000 ml  Net 3728.98 ml   Filed Weights   05/29/19 1131  Weight: 97.1 kg    Examination: General: Ill-appearing man lying in bed no acute distress, improved compared to previous exams HENT: [place]/AT, eyes anicteric, mucous membranes moist Lungs: Breathing comfortably on room air, clear to auscultation bilaterally Cardiovascular: Tachycardic, regular rhythm, no murmurs Abdomen: Soft, nontender, nondistended Extremities: Warm, no edema.  No clubbing or cyanosis. Neuro: Awake and alert, moving all extremities spontaneously.  More interactive with faster responses today. Derm: Skin warm and well perfused, no diaphoresis  Resolved Hospital Problem list     Assessment & Plan:   Severe anion gap metabolic acidosis in the setting of diabetic ketoacidosis further complicated by lactic acidosis.  A1c 13.1-very uncontrolled as an outpatient. -Suspect lactic acidosis is being driven by work of breathing and volume depletion -Lactic acid has improved with initial fluid challenge Plan -Repeat BMP pending -Continue Endotool IV insulin until anion gap closes -Continue IV fluids -Serial BMPs  COVID-19 infection With concern about right lower lobe Covid pneumonia Plan -Continue remdesivir per hospital protocol, despite his lack of hypoxia -Continue daily inflammatory markers per protocol -Pulse oximetry -If he develops hypoxia, will initiate Decadron 6 mg IV daily -Enoxaparin for Covid protocol.  SIRS/severe sepsis in the setting of Covid infection Plan -IV fluid  resuscitation, okay for sips and chips for oral rehydration  AKI-likely prerenal due to DKA, improving.  Unknown baseline renal function. -Continue to monitor -Continue hydration -I/O -Avoid nephrotoxic meds and renally dose medications -Discontinuing lisinopril that was  started overnight  Best practice:  Diet: NPO Pain/Anxiety/Delirium protocol (if indicated): NA VAP protocol (if indicated): NA DVT prophylaxis: [place] LMWH GI prophylaxis: n/a Glucose control: DKA Mobility: OOB w assist  Code Status: full code  Family Communication: pending  Disposition:admit to ICU  Labs   CBC: Recent Labs  Lab 05/29/19 1133 05/29/19 1653  WBC 17.1* 18.3*  NEUTROABS 13.7*  --   HGB 19.2* 16.8  HCT 58.2* 49.6  MCV 88.3 87.0  PLT 451* 195    Basic Metabolic Panel: Recent Labs  Lab 05/29/19 1133 05/29/19 1653 05/29/19 1850 05/29/19 2244 05/30/19 0258  NA 131* 138 140 144 140  K 5.3* 3.6 4.7 4.3 4.0  CL 93* 112* 108 113* 111  CO2 <7* <7* 9* 9* 14*  GLUCOSE 719* 394* 344* 241* 180*  BUN 54* 50* 49* 45* 38*  CREATININE 2.31* 1.82* 1.80* 1.53* 1.32*  CALCIUM 9.6 7.7* 8.7* 8.8* 8.6*  MG  --   --   --   --  2.4  PHOS  --   --   --   --  2.6   GFR: Estimated Creatinine Clearance: 89.5 mL/min (A) (by C-G formula based on SCr of 1.32 mg/dL (H)). Recent Labs  Lab 05/29/19 1133 05/29/19 1136 05/29/19 1336 05/29/19 1653 05/29/19 1850  PROCALCITON <0.10  --   --   --   --   WBC 17.1*  --   --  18.3*  --   LATICACIDVEN  --  3.8* 2.7*  --  2.8*    Liver Function Tests: Recent Labs  Lab 05/29/19 1133 05/30/19 0258  AST 16 12*  ALT 22 15  ALKPHOS 95 64  BILITOT 2.0* 0.7  PROT 9.1* 6.1*  ALBUMIN 3.9 2.8*   No results for input(s): LIPASE, AMYLASE in the last 168 hours. No results for input(s): AMMONIA in the last 168 hours.  ABG    Component Value Date/Time   HCO3 5.0 (L) 05/29/2019 1325   ACIDBASEDEF 27.3 (H) 05/29/2019 1325   O2SAT 72.8 05/29/2019 1325     Coagulation Profile: No results for input(s): INR, PROTIME in the last 168 hours.  Cardiac Enzymes: No results for input(s): CKTOTAL, CKMB, CKMBINDEX, TROPONINI in the last 168 hours.  HbA1C: Hgb A1c MFr Bld  Date/Time Value Ref Range Status  05/30/2019 02:58 AM 13.1 (H) 4.8 -  5.6 % Final    Comment:    (NOTE) Pre diabetes:          5.7%-6.4% Diabetes:              >6.4% Glycemic control for   <7.0% adults with diabetes     CBG: Recent Labs  Lab 05/29/19 1655 05/29/19 1852 05/29/19 2009 05/29/19 2123 05/29/19 2217  GLUCAP 450* 312* 236* 225* 205*     This patient is critically ill with multiple organ system failure which requires frequent high complexity decision making, assessment, support, evaluation, and titration of therapies. This was completed through the application of advanced monitoring technologies and extensive interpretation of multiple databases. During this encounter critical care time was devoted to patient care services described in this note for 35 minutes.  Julian Hy, DO 05/30/19 12:40 PM [facility]

## 2019-05-31 DIAGNOSIS — U071 COVID-19: Principal | ICD-10-CM

## 2019-05-31 DIAGNOSIS — E0810 Diabetes mellitus due to underlying condition with ketoacidosis without coma: Secondary | ICD-10-CM

## 2019-05-31 LAB — BASIC METABOLIC PANEL
Anion gap: 8 (ref 5–15)
Anion gap: 8 (ref 5–15)
Anion gap: 7 (ref 5–15)
Anion gap: 10 (ref 5–15)
BUN: 18 mg/dL (ref 6–20)
BUN: 16 mg/dL (ref 6–20)
BUN: 16 mg/dL (ref 6–20)
BUN: 13 mg/dL (ref 6–20)
CO2: 19 mmol/L — ABNORMAL LOW (ref 22–32)
CO2: 20 mmol/L — ABNORMAL LOW (ref 22–32)
CO2: 21 mmol/L — ABNORMAL LOW (ref 22–32)
CO2: 18 mmol/L — ABNORMAL LOW (ref 22–32)
Calcium: 8.3 mg/dL — ABNORMAL LOW (ref 8.9–10.3)
Calcium: 8.2 mg/dL — ABNORMAL LOW (ref 8.9–10.3)
Calcium: 8.3 mg/dL — ABNORMAL LOW (ref 8.9–10.3)
Calcium: 7.9 mg/dL — ABNORMAL LOW (ref 8.9–10.3)
Chloride: 113 mmol/L — ABNORMAL HIGH (ref 98–111)
Chloride: 113 mmol/L — ABNORMAL HIGH (ref 98–111)
Chloride: 112 mmol/L — ABNORMAL HIGH (ref 98–111)
Chloride: 106 mmol/L (ref 98–111)
Creatinine, Ser: 0.95 mg/dL (ref 0.61–1.24)
Creatinine, Ser: 0.9 mg/dL (ref 0.61–1.24)
Creatinine, Ser: 0.81 mg/dL (ref 0.61–1.24)
Creatinine, Ser: 0.79 mg/dL (ref 0.61–1.24)
GFR calc Af Amer: >60 mL/min (ref >60)
GFR calc Af Amer: >60 mL/min (ref >60)
GFR calc Af Amer: >60 mL/min (ref >60)
GFR calc Af Amer: >60 mL/min (ref >60)
GFR calc non Af Amer: >60 mL/min (ref >60)
GFR calc non Af Amer: >60 mL/min (ref >60)
GFR calc non Af Amer: >60 mL/min (ref >60)
GFR calc non Af Amer: >60 mL/min (ref >60)
Glucose, Bld: 161 mg/dL — ABNORMAL HIGH (ref 70–99)
Glucose, Bld: 190 mg/dL — ABNORMAL HIGH (ref 70–99)
Glucose, Bld: 208 mg/dL — ABNORMAL HIGH (ref 70–99)
Glucose, Bld: 183 mg/dL — ABNORMAL HIGH (ref 70–99)
Potassium: 3.6 mmol/L (ref 3.5–5.1)
Potassium: 3.5 mmol/L (ref 3.5–5.1)
Potassium: 3.2 mmol/L — ABNORMAL LOW (ref 3.5–5.1)
Potassium: 3.1 mmol/L — ABNORMAL LOW (ref 3.5–5.1)
Sodium: 140 mmol/L (ref 135–145)
Sodium: 141 mmol/L (ref 135–145)
Sodium: 140 mmol/L (ref 135–145)
Sodium: 134 mmol/L — ABNORMAL LOW (ref 135–145)

## 2019-05-31 LAB — MAGNESIUM: Magnesium: 2.5 mg/dL — ABNORMAL HIGH (ref 1.7–2.4)

## 2019-05-31 LAB — HEPATIC FUNCTION PANEL
ALT: 11 U/L (ref 0–44)
AST: 12 U/L — ABNORMAL LOW (ref 15–41)
Albumin: 2.7 g/dL — ABNORMAL LOW (ref 3.5–5.0)
Alkaline Phosphatase: 53 U/L (ref 38–126)
Bilirubin, Direct: 0.1 mg/dL (ref 0.0–0.2)
Indirect Bilirubin: 0.6 mg/dL (ref 0.3–0.9)
Total Bilirubin: 0.7 mg/dL (ref 0.3–1.2)
Total Protein: 6 g/dL — ABNORMAL LOW (ref 6.5–8.1)

## 2019-05-31 LAB — C-REACTIVE PROTEIN: CRP: 3.7 mg/dL — ABNORMAL HIGH (ref <1.0)

## 2019-05-31 LAB — GLUCOSE, CAPILLARY
Glucose-Capillary: 148 mg/dL — ABNORMAL HIGH (ref 70–99)
Glucose-Capillary: 151 mg/dL — ABNORMAL HIGH (ref 70–99)
Glucose-Capillary: 153 mg/dL — ABNORMAL HIGH (ref 70–99)
Glucose-Capillary: 155 mg/dL — ABNORMAL HIGH (ref 70–99)
Glucose-Capillary: 164 mg/dL — ABNORMAL HIGH (ref 70–99)
Glucose-Capillary: 166 mg/dL — ABNORMAL HIGH (ref 70–99)
Glucose-Capillary: 170 mg/dL — ABNORMAL HIGH (ref 70–99)
Glucose-Capillary: 185 mg/dL — ABNORMAL HIGH (ref 70–99)
Glucose-Capillary: 143 mg/dL — ABNORMAL HIGH (ref 70–99)

## 2019-05-31 LAB — PHOSPHORUS: Phosphorus: 1.7 mg/dL — ABNORMAL LOW (ref 2.5–4.6)

## 2019-05-31 LAB — D-DIMER, QUANTITATIVE: D-Dimer, Quant: 2.27 ug/mL-FEU — ABNORMAL HIGH (ref 0.00–0.50)

## 2019-05-31 LAB — FERRITIN: Ferritin: 1566 ng/mL — ABNORMAL HIGH (ref 24–336)

## 2019-05-31 MED ORDER — INSULIN DETEMIR 100 UNIT/ML ~~LOC~~ SOLN
18.0000 [IU] | Freq: Two times a day (BID) | SUBCUTANEOUS | Status: DC
  Administered 2019-05-31 – 2019-06-01 (×3): 18 [IU] via SUBCUTANEOUS
  Filled 2019-05-31 (×6): qty 0.18

## 2019-05-31 MED ORDER — INSULIN ASPART 100 UNIT/ML ~~LOC~~ SOLN
2.0000 [IU] | SUBCUTANEOUS | Status: DC
  Administered 2019-05-31 (×2): 2 [IU] via SUBCUTANEOUS
  Administered 2019-05-31: 13:00:00 6 [IU] via SUBCUTANEOUS
  Administered 2019-05-31: 06:00:00 4 [IU] via SUBCUTANEOUS
  Administered 2019-06-01 (×2): 2 [IU] via SUBCUTANEOUS

## 2019-05-31 MED ORDER — CLONIDINE HCL 0.1 MG PO TABS
0.1000 mg | ORAL_TABLET | Freq: Three times a day (TID) | ORAL | Status: DC
  Administered 2019-05-31 – 2019-06-03 (×9): 0.1 mg via ORAL
  Filled 2019-05-31 (×9): qty 1

## 2019-05-31 NOTE — Progress Notes (Signed)
NAME:  Benjamin [name], MRN:  [id number], DOB:  04/17/1976, LOS: 2 ADMISSION DATE:  05/29/2019, CONSULTATION DATE:  12/24 REFERRING MD:  Dayna Barker, CHIEF COMPLAINT:, altered MS, SOB, DKA  Brief History   This is a 43 year old male admitted 12/24 w/ dx of COVID-19 (12/14) with onset of symptoms 05/17/19. Presents 12/24 w/ cc: N/V, worsening shortness of breath and DKA  History of present illness   43 year old diabetic who was diagnosed w/ COVID-19 on 12/14 3 days after exposure to COVID+ patient. Was sent home w/ instructions to quarantine and treat symptomatically. Had actually been flagged as a candidate for monoclonal AB infusion but was a no-show to appointment. Presented to the ED at [facility] on 12/24 w/ cc: nausea, vomiting, intermittent fevers, 3-4 day h/o worsening shortness of breath and right flank pain.  Is unclear as to if he was taking his insulin during the time he was nauseated and vomiting In ER: HR 120s. RR high 20s, EBC 7.03, CO2 < 7, glucose 719, PCT < 0.1, lactate 3.8, CRP 2.2, ferritin 4957, LDH 195, ddimer 2.65. Beta-hydroxybuteric acid > 8, VBG 7.03, pco2 19.5, po2 46.6 bicarb 5 -Was given 1.5 liters saline -Insulin started.  -LA cleared from 3.8 to 2.7 -PCCM asked to eval   Significant Hospital Events   12/14 diagnosed in urgent care for  Covid 19 12/24 admitted with DKA Remdesivir started IVFs administered. IVFs administered. DKA protocol started CRP 2.2, ferritin 4957, LDH 195, ddimer 2.65. 05/31/2019  transitioned off IV insulin   Consults:  pccm  Procedures:   Significant Diagnostic Tests:    Micro Data:  MRSA PCR  12/24 neg BC  x 2  12/24: >>>  Micro Rx   remdesivir 12/24>>>  Interim history/subjective:  Extremely weak but not sob, dry cough on insp/ sats fine on RA Still some nausea, gagging/ no appetite   Objective   Blood pressure (!) 176/97, pulse 97, temperature 98.9 F (37.2 C), temperature source Oral, resp. rate (!) 6, height 6\' 1"  (1.854 m), weight  97.1 kg, SpO2 99 %.        Intake/Output Summary (Last 24 hours) at 05/31/2019 0947 Last data filed at 05/31/2019 0620 Gross per 24 hour  Intake 3432.37 ml  Output 2450 ml  Net 982.37 ml   Filed Weights   05/29/19 1131  Weight: 97.1 kg    Examination: Weak wm, cough on insp dry quality/ RA sats 99%   Pt alert, approp   @ 30 degrees hob No jvd Oropharynx clear,  mucosa  Very dry Neck supple Lungs with a few crackles in both bases  RRR no s3 or or sign murmur Abd obese with nl  excursion  Extr warm with no edema or clubbing noted Neuro  Sensorium nl ,  no apparent motor deficits      Resolved Hospital Problem list     Assessment & Plan:   Severe anion gap metabolic acidosis in the setting of diabetic ketoacidosis    A1c 13.1  c/w uncontrolled as an outpatient and trensient mild lactic acidosis Plan - transitioned to hyperglycemia phase 3  @ 3am 12/26 by Elink > continue -Continue IV fluids -Serial BMPs - try sips/chips, advance as tolerated   COVID-19 infection With concern about right lower lobe Covid pneumonia Plan -Continue remdesivir per hospital protocol, despite his lack of hypoxia -Continue daily inflammatory markers per protocol -Pulse oximetry -Enoxaparin for Covid protocol.  SIRS/severe sepsis in the setting of Covid infection Plan -  IV fluid resuscitation, okay for sips and chips for oral rehydration  AKI-likely prerenal due to DKA, improving.  Unknown baseline renal function. Lab Results  Component Value Date   CREATININE 0.95 05/31/2019   CREATININE 0.97 05/30/2019   CREATININE 0.96 05/30/2019    -Continue to monitor -Continue hydration -I/O -Avoid nephrotoxic meds and renally dose medications    Best practice:  Diet: clear liq/ advance as tol Pain/Anxiety/Delirium protocol (if indicated): NA VAP protocol (if indicated): NA DVT prophylaxis: [place] LMWH GI prophylaxis: n/a Glucose control:  Started phase 3  In am 12/26  Mobility: this  will be a major challenge as still can't sit up s assistance > PT likely needed  Code Status: full code  Family Communication:  Per nursing/ pt doing better Disposition:admit to ICU  Labs   CBC: Recent Labs  Lab 05/29/19 1133 05/29/19 1653  WBC 17.1* 18.3*  NEUTROABS 13.7*  --   HGB 19.2* 16.8  HCT 58.2* 49.6  MCV 88.3 87.0  PLT 451* 161    Basic Metabolic Panel: Recent Labs  Lab 05/30/19 0258 05/30/19 1125 05/30/19 1551 05/30/19 2140 05/31/19 0255  NA 140 144 141 141 140  K 4.0 3.3* 3.6 4.5 3.6  CL 111 116* 114* 114* 113*  CO2 14* 18* 18* 21* 19*  GLUCOSE 180* 152* 180* 176* 161*  BUN 38* 31* 24* 21* 18  CREATININE 1.32* 1.07 0.96 0.97 0.95  CALCIUM 8.6* 8.6* 8.4* 8.4* 8.3*  MG 2.4  --  2.5*  --  2.5*  PHOS 2.6  --   --   --  1.7*   GFR: Estimated Creatinine Clearance: 124.4 mL/min (by C-G formula based on SCr of 0.95 mg/dL). Recent Labs  Lab 05/29/19 1133 05/29/19 1136 05/29/19 1336 05/29/19 1653 05/29/19 1850  PROCALCITON <0.10  --   --   --   --   WBC 17.1*  --   --  18.3*  --   LATICACIDVEN  --  3.8* 2.7*  --  2.8*    Liver Function Tests: Recent Labs  Lab 05/29/19 1133 05/30/19 0258 05/31/19 0255  AST 16 12* 12*  ALT 22 15 11   ALKPHOS 95 64 53  BILITOT 2.0* 0.7 0.7  PROT 9.1* 6.1* 6.0*  ALBUMIN 3.9 2.8* 2.7*   No results for input(s): LIPASE, AMYLASE in the last 168 hours. No results for input(s): AMMONIA in the last 168 hours.  ABG    Component Value Date/Time   HCO3 5.0 (L) 05/29/2019 1325   ACIDBASEDEF 27.3 (H) 05/29/2019 1325   O2SAT 72.8 05/29/2019 1325     Coagulation Profile: No results for input(s): INR, PROTIME in the last 168 hours.  Cardiac Enzymes: No results for input(s): CKTOTAL, CKMB, CKMBINDEX, TROPONINI in the last 168 hours.  HbA1C: Hgb A1c MFr Bld  Date/Time Value Ref Range Status  05/30/2019 02:58 AM 13.1 (H) 4.8 - 5.6 % Final    Comment:    (NOTE) Pre diabetes:          5.7%-6.4% Diabetes:               >6.4% Glycemic control for   <7.0% adults with diabetes     CBG: Recent Labs  Lab 05/30/19 2230 05/30/19 2334 05/31/19 0132 05/31/19 0333 05/31/19 0617  GLUCAP 153* 155* 148* 151* 170*    Christinia Gully, MD Pulmonary and [place] [phone] After 5:30 PM or weekends, use [facility] [phone]

## 2019-05-31 NOTE — Progress Notes (Signed)
[place] Progress Note Patient Name: FORTINO HAAG DOB: 1976-02-01 MRN: [id number]   Date of Service  05/31/2019  HPI/Events of Note  Pt with resolved DKA on an insulin infusion at the rate of 3.6 units per hour, anion gap is currently 6.  eICU Interventions  Hyperglycemia phase 3 (transition from insulin infusion) orders entered.        Kerry Kass Laparis Durrett 05/31/2019, 3:30 AM

## 2019-06-01 LAB — GLUCOSE, CAPILLARY
Glucose-Capillary: 105 mg/dL — ABNORMAL HIGH (ref 70–99)
Glucose-Capillary: 128 mg/dL — ABNORMAL HIGH (ref 70–99)
Glucose-Capillary: 133 mg/dL — ABNORMAL HIGH (ref 70–99)
Glucose-Capillary: 141 mg/dL — ABNORMAL HIGH (ref 70–99)
Glucose-Capillary: 150 mg/dL — ABNORMAL HIGH (ref 70–99)
Glucose-Capillary: 155 mg/dL — ABNORMAL HIGH (ref 70–99)
Glucose-Capillary: 206 mg/dL — ABNORMAL HIGH (ref 70–99)
Glucose-Capillary: 69 mg/dL — ABNORMAL LOW (ref 70–99)
Glucose-Capillary: 94 mg/dL (ref 70–99)
Glucose-Capillary: 95 mg/dL (ref 70–99)

## 2019-06-01 LAB — HEPATIC FUNCTION PANEL
ALT: 11 U/L (ref 0–44)
AST: 10 U/L — ABNORMAL LOW (ref 15–41)
Albumin: 2.3 g/dL — ABNORMAL LOW (ref 3.5–5.0)
Alkaline Phosphatase: 54 U/L (ref 38–126)
Bilirubin, Direct: 0.1 mg/dL (ref 0.0–0.2)
Indirect Bilirubin: 0.5 mg/dL (ref 0.3–0.9)
Total Bilirubin: 0.6 mg/dL (ref 0.3–1.2)
Total Protein: 5.2 g/dL — ABNORMAL LOW (ref 6.5–8.1)

## 2019-06-01 LAB — FERRITIN: Ferritin: 1240 ng/mL — ABNORMAL HIGH (ref 24–336)

## 2019-06-01 LAB — C-REACTIVE PROTEIN: CRP: 8 mg/dL — ABNORMAL HIGH (ref <1.0)

## 2019-06-01 LAB — D-DIMER, QUANTITATIVE: D-Dimer, Quant: 2.07 ug/mL-FEU — ABNORMAL HIGH (ref 0.00–0.50)

## 2019-06-01 LAB — MAGNESIUM: Magnesium: 2.1 mg/dL (ref 1.7–2.4)

## 2019-06-01 LAB — PHOSPHORUS: Phosphorus: 2.4 mg/dL — ABNORMAL LOW (ref 2.5–4.6)

## 2019-06-01 MED ORDER — INSULIN ASPART 100 UNIT/ML ~~LOC~~ SOLN
0.0000 [IU] | Freq: Every day | SUBCUTANEOUS | Status: DC
  Administered 2019-06-02: 2 [IU] via SUBCUTANEOUS

## 2019-06-01 MED ORDER — POTASSIUM CHLORIDE CRYS ER 20 MEQ PO TBCR
40.0000 meq | EXTENDED_RELEASE_TABLET | Freq: Two times a day (BID) | ORAL | Status: DC
  Administered 2019-06-01 – 2019-06-03 (×5): 40 meq via ORAL
  Filled 2019-06-01 (×5): qty 2

## 2019-06-01 MED ORDER — INSULIN ASPART 100 UNIT/ML ~~LOC~~ SOLN
0.0000 [IU] | Freq: Three times a day (TID) | SUBCUTANEOUS | Status: DC
  Administered 2019-06-01 – 2019-06-02 (×2): 1 [IU] via SUBCUTANEOUS
  Administered 2019-06-02 (×2): 2 [IU] via SUBCUTANEOUS
  Administered 2019-06-03: 12:00:00 3 [IU] via SUBCUTANEOUS
  Administered 2019-06-03: 2 [IU] via SUBCUTANEOUS

## 2019-06-01 MED ORDER — LIP MEDEX EX OINT
1.0000 "application " | TOPICAL_OINTMENT | CUTANEOUS | Status: DC | PRN
  Filled 2019-06-01: qty 7

## 2019-06-01 NOTE — Progress Notes (Signed)
PROGRESS NOTE    Benjamin HuaBrian S [name]  ZOX:[id number] DOB: 10/19/1975 DOA: 05/29/2019 PCP: Benjamin [name], [name], MD    Brief Narrative:   Benjamin GambleBrian Roulhac is a 43 year old with PMHx diabetes mellitus, ADHD, depression, HTN, HLD with recent diagnosis of Covid on 05/19/2019 who presented to the ED on 05/29/2019 with nausea, vomiting, intermittent fevers.  Symptoms present for 3-4 days prior to ED arrival; and was unclear if he was taking his insulin during the time of these symptoms.  In the ED, HR 120s. RR high 20s, EBC 7.03, CO2 < 7, glucose 719, PCT < 0.1, lactate 3.8, CRP 2.2, ferritin 4957, LDH 195, ddimer 2.65. Beta-hydroxybuteric acid > 8, VBG 7.03, pco2 19.5, po2 46.6 bicarb 5.  Patient was given a 1.5 L fluid bolus and started on IV insulin.  Patient was initially admitted to the pulmonary/critical care service and [facility].  Assessment & Plan:   Principal Problem:   DKA (diabetic ketoacidoses) (HCC) Active Problems:   COVID-19 virus infection   Diabetic ketoacidosis Severe anion gap metabolic acidosis Patient presented to the ED with several day history of nausea/vomiting, unlikely taking his home insulin.  Hemoglobin A1c 13.1, which is consistent with poorly controlled diabetes.  Patient on NPH 75/25 35-40u TID; but unlikely consistent with his insulin regimen as well as dietary indiscretions. --Diabetic educator following, appreciate assistance --IV insulin now transition to Levemir 18u [place] BID --start clear liquid diet, advance as tolerated --Sensitive ISS for further coverage --CBGs qAC/HS --If nausea/vomiting return, may consider further work-up of gastroparesis with gastric emptying study  Hypokalemia Potassium of 3.1 this morning, will replete. --Repeat electrolytes in the morning to include magnesium  Acute renal failure: Resolved Creatinine 2.31 on admission.  Etiology likely secondary to DKA as above from underlying severe dehydration.  Now resolved with IV  fluid hydration and better control of glucose.  COVID-19 infection; present on admission Patient with positive Covid-19 on 05/19/2019.  Currently oxygenating well on room air. --Continue remdesivir to complete 5 day course (End: 12/28)  Blair PromiseWeakness/debility: --PT evaluation   DVT prophylaxis: Lovenox Code Status: Full code Family Communication: Discussed with patient's spouse via telephone this morning Disposition Plan: Continue inpatient, likely discharge home in 1-2 days.   Consultants:   PCCM - signed off 12/27  Procedures:   None  Antimicrobials:   Remdesivir 12/24>>   Subjective: Patient seen and examined at bedside, resting comfortably.  States nausea has now resolved.  Would like to start a diet.  Glucose now under better control, transitioned off of IV insulin.  No other complaints or concerns at this time.  Discussed extensively needs to be compliant with his diabetic regimen and dietary indiscretions.  Denies headache, no fever/chills/night sweats, no nausea/vomiting/diarrhea, no chest pain, palpitations, no abdominal pain, no fatigue, no paresthesias.  Objective: Vitals:   06/01/19 0815 06/01/19 0821 06/01/19 0900 06/01/19 1000  BP:   (!) 174/99 (!) 186/111  Pulse: 92  91 98  Resp: 19  (!) 8 18  Temp:  98.1 F (36.7 C)    TempSrc:  Oral    SpO2: 97%  97% 98%  Weight:      Height:        Intake/Output Summary (Last 24 hours) at 06/01/2019 1152 Last data filed at 06/01/2019 1032 Gross per 24 hour  Intake 2848.61 ml  Output 1350 ml  Net 1498.61 ml   Filed Weights   05/29/19 1131  Weight: 97.1 kg    Examination:  General exam: Appears  calm and comfortable  Respiratory system: Clear to auscultation. Respiratory effort normal. Cardiovascular system: S1 & S2 heard, RRR. No JVD, murmurs, rubs, gallops or clicks. No pedal edema. Gastrointestinal system: Abdomen is nondistended, soft and nontender. No organomegaly or masses felt. Normal bowel sounds  heard. Central nervous system: Alert and oriented. No focal neurological deficits. Extremities: Symmetric 5 x 5 power. Skin: No rashes, lesions or ulcers Psychiatry: Judgement and insight appear normal. Mood & affect appropriate.     Data Reviewed: I have personally reviewed following labs and imaging studies  CBC: Recent Labs  Lab 05/29/19 1133 05/29/19 1653  WBC 17.1* 18.3*  NEUTROABS 13.7*  --   HGB 19.2* 16.8  HCT 58.2* 49.6  MCV 88.3 87.0  PLT 451* 308   Basic Metabolic Panel: Recent Labs  Lab 05/30/19 0258 05/30/19 1551 05/30/19 2140 05/31/19 0255 05/31/19 0952 05/31/19 1544 05/31/19 2140 06/01/19 0224  NA 140 141 141 140 141 140 134*  --   K 4.0 3.6 4.5 3.6 3.5 3.2* 3.1*  --   CL 111 114* 114* 113* 113* 112* 106  --   CO2 14* 18* 21* 19* 20* 21* 18*  --   GLUCOSE 180* 180* 176* 161* 190* 208* 183*  --   BUN 38* 24* 21* 18 16 16 13   --   CREATININE 1.32* 0.96 0.97 0.95 0.90 0.81 0.79  --   CALCIUM 8.6* 8.4* 8.4* 8.3* 8.2* 8.3* 7.9*  --   MG 2.4 2.5*  --  2.5*  --   --   --  2.1  PHOS 2.6  --   --  1.7*  --   --   --  2.4*   GFR: Estimated Creatinine Clearance: 147.7 mL/min (by C-G formula based on SCr of 0.79 mg/dL). Liver Function Tests: Recent Labs  Lab 05/29/19 1133 05/30/19 0258 05/31/19 0255 06/01/19 0224  AST 16 12* 12* 10*  ALT 22 15 11 11   ALKPHOS 95 64 53 54  BILITOT 2.0* 0.7 0.7 0.6  PROT 9.1* 6.1* 6.0* 5.2*  ALBUMIN 3.9 2.8* 2.7* 2.3*   No results for input(s): LIPASE, AMYLASE in the last 168 hours. No results for input(s): AMMONIA in the last 168 hours. Coagulation Profile: No results for input(s): INR, PROTIME in the last 168 hours. Cardiac Enzymes: No results for input(s): CKTOTAL, CKMB, CKMBINDEX, TROPONINI in the last 168 hours. BNP (last 3 results) No results for input(s): PROBNP in the last 8760 hours. HbA1C: Recent Labs    05/30/19 0258  HGBA1C 13.1*   CBG: Recent Labs  Lab 05/31/19 2053 06/01/19 0041  06/01/19 0409 06/01/19 0818 06/01/19 0845  GLUCAP 150* 141* 133* 69* 94   Lipid Profile: No results for input(s): CHOL, HDL, LDLCALC, TRIG, CHOLHDL, LDLDIRECT in the last 72 hours. Thyroid Function Tests: No results for input(s): TSH, T4TOTAL, FREET4, T3FREE, THYROIDAB in the last 72 hours. Anemia Panel: Recent Labs    05/31/19 0255 06/01/19 0224  FERRITIN 1,566* 1,240*   Sepsis Labs: Recent Labs  Lab 05/29/19 1133 05/29/19 1136 05/29/19 1336 05/29/19 1850  PROCALCITON <0.10  --   --   --   LATICACIDVEN  --  3.8* 2.7* 2.8*    Recent Results (from the past 240 hour(s))  Blood Culture (routine x 2)     Status: None (Preliminary result)   Collection Time: 05/29/19 11:36 AM   Specimen: BLOOD  Result Value Ref Range Status   Specimen Description   Final    BLOOD LEFT ANTECUBITAL Performed  at [facility], [place] [address] [place], [place] [address]    Special Requests   Final    BOTTLES DRAWN AEROBIC AND ANAEROBIC Blood Culture adequate volume Performed at [place] [address], [place], [place] [address]    Culture   Final    NO GROWTH 3 DAYS Performed at [facility], [place] [address] [place], [place] [address]    Report Status PENDING  Incomplete  Blood Culture (routine x 2)     Status: None (Preliminary result)   Collection Time: 05/29/19 11:41 AM   Specimen: BLOOD  Result Value Ref Range Status   Specimen Description   Final    BLOOD SITE NOT SPECIFIED Performed at [place] [address] [place], [place] [address]    Special Requests   Final    BOTTLES DRAWN AEROBIC AND ANAEROBIC Blood Culture adequate volume Performed at [place] [address] [place], [place] [address]    Culture   Final    NO GROWTH 3 DAYS Performed at [facility], [place] [address], [place], [place] [address]    Report Status PENDING  Incomplete  MRSA PCR Screening     Status:  None   Collection Time: 05/29/19  9:23 PM   Specimen: Nasal Mucosa; Nasopharyngeal  Result Value Ref Range Status   MRSA by PCR NEGATIVE NEGATIVE Final    Comment:        The GeneXpert MRSA Assay (FDA approved for NASAL specimens only), is one component of a comprehensive MRSA colonization surveillance program. It is not intended to diagnose MRSA infection nor to guide or monitor treatment for MRSA infections. Performed at [facility], [place] [address], [place], [place] [address]          Radiology Studies: No results found.      Scheduled Meds: . Chlorhexidine Gluconate Cloth  6 each Topical Daily  . cloNIDine  0.1 mg Oral TID  . enoxaparin (LOVENOX) injection  40 mg Subcutaneous Q24H  . insulin aspart  0-5 Units Subcutaneous QHS  . insulin aspart  0-9 Units Subcutaneous TID WC  . mouth rinse  15 mL Mouth Rinse BID  . potassium chloride  40 mEq Oral BID   Continuous Infusions: . lactated ringers 20 mL/hr at 06/01/19 1032  . remdesivir 100 mg in NS 100 mL Stopped (06/01/19 1023)     LOS: 3 days    Time spent: 36 minutes spent on chart review, discussion with nursing staff, consultants, updating family and interview/physical exam; more than 50% of that time was spent in counseling and/or coordination of care.    Phoua Hoadley J [place] DO [facility] 06/01/2019, 11:52 AM

## 2019-06-01 NOTE — Progress Notes (Addendum)
Inpatient Diabetes Program Recommendations  AACE/ADA: New Consensus Statement on Inpatient Glycemic Control   Target Ranges:  Prepandial:   less than 140 mg/dL      Peak postprandial:   less than 180 mg/dL (1-2 hours)      Critically ill patients:  140 - 180 mg/dL   Results for Benjamin [name], Benjamin [name] (MRN [id number]) as of 06/01/2019 09:21  Ref. Range 05/31/19 4:04 05/31/2019 06:17 05/31/2019 07:56 05/31/2019 12:58 05/31/2019 17:33 05/31/2019 20:53 06/01/2019 00:41 06/01/2019 04:09 06/01/2019 08:18 06/01/2019 08:45  Glucose-Capillary Latest Ref Range: 70 - 99 mg/dL    Levemir 18 units 170 (H)  Novolog 4 units 155 (H) 206 (H)  Novolog 6 units 143 (H)  Novolog 2 units 150 (H)  Novolog 2 units  Levemir 18 units 141 (H)  Novolog 2 units 133 (H)  Novolog 2 units 69 (L) 94  Results for MURDOCK, JELLISON (MRN [id number]) as of 06/01/2019 09:21  Ref. Range 05/30/2019 02:58  Hemoglobin A1C Latest Ref Range: 4.8 - 5.6 % 13.1 (H)   Review of Glycemic Control  Diabetes history: DM Outpatient Diabetes medications: Humalog 75/25 35-40 units TID with meals Current orders for Inpatient glycemic control: Levemir 18 units Q12H, Novolog 2-6 units Q4H  Inpatient Diabetes Program Recommendations:   Insulin-Correction: Please discontinue ICU glycemic control order set and use Regular glycemic order set to order CBGs Q4H with Novolog 0-6 units Q4H (if patient eating consider changing frequency of CBGs and Novolog 0-6 units to AC&HS).  Insulin-Meal Coverage: Noted clear liquid diet ordered this morning. Once diet is advanced and patient is eating at least 50% of meals, may want to consider ordering Novolog meal coverage TID with meals.  Note: Noted consult for Diabetes Coordinator. Diabetes Coordinator is not on campus over the weekend but available by pager from 8am to 5pm for questions or concerns. Chart reviewed. Diabetes Coordinator will follow up on patient on Monday 06/02/19 regarding A1C and glycemic  control.    Thanks, Barnie Alderman, RN, MSN, CDE Diabetes Coordinator Inpatient Diabetes Program [phone] (Team Pager from 8am to 5pm)

## 2019-06-02 LAB — HEPATIC FUNCTION PANEL
ALT: 11 U/L (ref 0–44)
AST: 11 U/L — ABNORMAL LOW (ref 15–41)
Albumin: 2.1 g/dL — ABNORMAL LOW (ref 3.5–5.0)
Alkaline Phosphatase: 57 U/L (ref 38–126)
Bilirubin, Direct: 0.1 mg/dL (ref 0.0–0.2)
Indirect Bilirubin: 0.7 mg/dL (ref 0.3–0.9)
Total Bilirubin: 0.8 mg/dL (ref 0.3–1.2)
Total Protein: 5.1 g/dL — ABNORMAL LOW (ref 6.5–8.1)

## 2019-06-02 LAB — BASIC METABOLIC PANEL WITH GFR
Anion gap: 9 (ref 5–15)
BUN: 10 mg/dL (ref 6–20)
CO2: 23 mmol/L (ref 22–32)
Calcium: 8.1 mg/dL — ABNORMAL LOW (ref 8.9–10.3)
Chloride: 106 mmol/L (ref 98–111)
Creatinine, Ser: 0.63 mg/dL (ref 0.61–1.24)
GFR calc Af Amer: >60 mL/min
GFR calc non Af Amer: >60 mL/min
Glucose, Bld: 112 mg/dL — ABNORMAL HIGH (ref 70–99)
Potassium: 3.5 mmol/L (ref 3.5–5.1)
Sodium: 138 mmol/L (ref 135–145)

## 2019-06-02 LAB — GLUCOSE, CAPILLARY
Glucose-Capillary: 128 mg/dL — ABNORMAL HIGH (ref 70–99)
Glucose-Capillary: 182 mg/dL — ABNORMAL HIGH (ref 70–99)
Glucose-Capillary: 162 mg/dL — ABNORMAL HIGH (ref 70–99)
Glucose-Capillary: 207 mg/dL — ABNORMAL HIGH (ref 70–99)

## 2019-06-02 LAB — D-DIMER, QUANTITATIVE: D-Dimer, Quant: 2.23 ug/mL-FEU — ABNORMAL HIGH (ref 0.00–0.50)

## 2019-06-02 LAB — C-REACTIVE PROTEIN: CRP: 10.6 mg/dL — ABNORMAL HIGH

## 2019-06-02 LAB — FERRITIN: Ferritin: 1131 ng/mL — ABNORMAL HIGH (ref 24–336)

## 2019-06-02 LAB — MAGNESIUM: Magnesium: 1.9 mg/dL (ref 1.7–2.4)

## 2019-06-02 LAB — PHOSPHORUS: Phosphorus: 2.5 mg/dL (ref 2.5–4.6)

## 2019-06-02 MED ORDER — INSULIN DETEMIR 100 UNIT/ML ~~LOC~~ SOLN
18.0000 [IU] | Freq: Every day | SUBCUTANEOUS | Status: DC
  Administered 2019-06-02: 18 [IU] via SUBCUTANEOUS
  Filled 2019-06-02 (×2): qty 0.18

## 2019-06-02 NOTE — Evaluation (Signed)
Physical Therapy Evaluation Patient Details Name: Benjamin [name] MRN: [id number] DOB: Jun 19, 1975 Today's Date: 06/02/2019   History of Present Illness  43 year old with PMHx diabetes mellitus, ADHD, depression, HTN, HLD with recent diagnosis of Covid on 05/19/2019 who presented to the ED on 05/29/2019 with nausea, vomiting, intermittent fevers. Dx of DKA.  Clinical Impression  Pt is independent with mobility, He ambulated ~80' in the room without an assistive device, no loss of balance, SaO2 94-100% on room air, no dyspnea. No further PT indicated, will sign off. Encouraged pt to gradually increase activity level.     Follow Up Recommendations No PT follow up    Equipment Recommendations  None recommended by PT    Recommendations for Other Services       Precautions / Restrictions Precautions Precautions: None Precaution Comments: denies h/o falls in past 1 year Restrictions Weight Bearing Restrictions: No      Mobility  Bed Mobility Overal bed mobility: Modified Independent             General bed mobility comments: HOB up 30*  Transfers Overall transfer level: Independent                  Ambulation/Gait Ambulation/Gait assistance: Independent Gait Distance (Feet): 80 Feet Assistive device: None Gait Pattern/deviations: WFL(Within Functional Limits) Gait velocity: WNL   General Gait Details: no loss of balance, ambulated in room, no dyspnea, SaO2 94-100% on room air  Stairs            Wheelchair Mobility    Modified Rankin (Stroke Patients Only)       Balance Overall balance assessment: Independent                                           Pertinent Vitals/Pain Pain Assessment: No/denies pain    Home Living Family/patient expects to be discharged to:: Private residence Living Arrangements: Spouse/significant other;Children Available Help at Discharge: Family;Available 24 hours/day Type of Home: House          Home Equipment: None      Prior Function Level of Independence: Independent               Hand Dominance        Extremity/Trunk Assessment   Upper Extremity Assessment Upper Extremity Assessment: Overall WFL for tasks assessed    Lower Extremity Assessment Lower Extremity Assessment: Overall WFL for tasks assessed    Cervical / Trunk Assessment Cervical / Trunk Assessment: Normal  Communication   Communication: No difficulties  Cognition Arousal/Alertness: Awake/alert Behavior During Therapy: WFL for tasks assessed/performed Overall Cognitive Status: Within Functional Limits for tasks assessed                                        General Comments      Exercises     Assessment/Plan    PT Assessment Patent does not need any further PT services  PT Problem List         PT Treatment Interventions      PT Goals (Current goals can be found in the Care Plan section)  Acute Rehab PT Goals Patient Stated Goal: yardwork, reading PT Goal Formulation: All assessment and education complete, DC therapy    Frequency     Barriers to discharge  Co-evaluation               AM-PAC PT "6 Clicks" Mobility  Outcome Measure Help needed turning from your back to your side while in a flat bed without using bedrails?: None Help needed moving from lying on your back to sitting on the side of a flat bed without using bedrails?: None Help needed moving to and from a bed to a chair (including a wheelchair)?: None Help needed standing up from a chair using your arms (e.g., wheelchair or bedside chair)?: None Help needed to walk in hospital room?: None Help needed climbing 3-5 steps with a railing? : None 6 Click Score: 24    End of Session   Activity Tolerance: Patient tolerated treatment well Patient left: in bed;with call bell/phone within reach Nurse Communication: Mobility status      Time: 7106-2694 PT Time Calculation (min)  (ACUTE ONLY): 20 min   Charges:   PT Evaluation $PT Eval Low Complexity: 1 Low         Philomena Doheny PT 06/02/2019  Acute Rehabilitation Services Pager [phone] Office [phone]

## 2019-06-02 NOTE — Progress Notes (Signed)
PROGRESS NOTE    Benjamin [name]  KPT:[id number] DOB: 04/30/1976 DOA: 05/29/2019 PCP: Creola Corn, MD    Brief Narrative:   Benjamin [name] is a 43 year old with PMHx diabetes mellitus, ADHD, depression, HTN, HLD with recent diagnosis of Covid on 05/19/2019 who presented to the ED on 05/29/2019 with nausea, vomiting, intermittent fevers.  Symptoms present for 3-4 days prior to ED arrival; and was unclear if he was taking his insulin during the time of these symptoms.  In the ED, HR 120s. RR high 20s, EBC 7.03, CO2 < 7, glucose 719, PCT < 0.1, lactate 3.8, CRP 2.2, ferritin 4957, LDH 195, ddimer 2.65. Beta-hydroxybuteric acid > 8, VBG 7.03, pco2 19.5, po2 46.6 bicarb 5.  Patient was given a 1.5 L fluid bolus and started on IV insulin.  Patient was initially admitted to the pulmonary/critical care service and transferred to [facility] on 06/01/2019.  Assessment & Plan:   Principal Problem:   DKA (diabetic ketoacidoses) (HCC) Active Problems:   COVID-19 virus infection   Diabetic ketoacidosis Severe anion gap metabolic acidosis Patient presented to the ED with several day history of nausea/vomiting, unlikely taking his home insulin.  Hemoglobin A1c 13.1, which is consistent with poorly controlled diabetes.  Patient on NPH 75/25 35-40u TID; but unlikely consistent with his insulin regimen as well as dietary indiscretions. --Diabetic educator following, appreciate assistance --IV insulin now transitioned to Levemir 18u [place] daily --transition FLD to consistent carbohydrate diet today --Sensitive ISS for further coverage --CBGs qAC/HS --If nausea/vomiting return, may consider further work-up of gastroparesis with gastric emptying study  Hypokalemia Potassium of 3.5 this morning --Repeat electrolytes in the morning to include magnesium  Acute renal failure: Resolved Creatinine 2.31 on admission.  Etiology likely secondary to DKA as above from underlying severe dehydration.  Now resolved with IV fluid  hydration and better control of glucose.   COVID-19 infection; present on admission Patient with positive Covid-19 on 05/19/2019.  Currently oxygenating well on room air. --Continue remdesivir to complete 5 day course (End: 12/28)  [facility] Promise: --PT evaluation pending   DVT prophylaxis: Lovenox Code Status: Full code Family Communication: Discussed with patient's spouse via telephone this morning Disposition Plan: Continue inpatient, transfer to med/surg today, awaiting PT evaluation, likely discharge home in 1-2 days.   Consultants:   PCCM - signed off 12/27  Procedures:   None  Antimicrobials:   Remdesivir 12/24 - 12/28   Subjective: Patient seen and examined at bedside, resting comfortably.  Currently tolerating full liquid diet, will transition to consistent carbohydrate today.  Blood sugar well controlled on 18 units of Levemir past 24 hours.  Discussed extensively needs to be compliant with his diabetic regimen and dietary indiscretions.  Denies headache, no fever/chills/night sweats, no nausea/vomiting/diarrhea, no chest pain, palpitations, no abdominal pain, no fatigue, no paresthesias.  No acute concerns overnight per nursing staff.  Objective: Vitals:   06/02/19 0800 06/02/19 0814 06/02/19 0900 06/02/19 1000  BP: (!) 142/104  (!) 163/100 (!) 177/108  Pulse: (!) 102  91 92  Resp: 15  17 11   Temp:  98.3 F (36.8 C)    TempSrc:  Oral    SpO2: 93%  97% 100%  Weight:      Height:        Intake/Output Summary (Last 24 hours) at 06/02/2019 1037 Last data filed at 06/02/2019 0600 Gross per 24 hour  Intake 2424.5 ml  Output 2050 ml  Net 374.5 ml   Filed Weights   05/29/19 1131  Weight:  97.1 kg    Examination:  General exam: Appears calm and comfortable  Respiratory system: Clear to auscultation. Respiratory effort normal. Cardiovascular system: S1 & S2 heard, RRR. No JVD, murmurs, rubs, gallops or clicks. No pedal edema. Gastrointestinal system:  Abdomen is nondistended, soft and nontender. No organomegaly or masses felt. Normal bowel sounds heard. Central nervous system: Alert and oriented. No focal neurological deficits. Extremities: Symmetric 5 x 5 power. Skin: No rashes, lesions or ulcers Psychiatry: Judgement and insight appear normal. Mood & affect appropriate.     Data Reviewed: I have personally reviewed following labs and imaging studies  CBC: Recent Labs  Lab 05/29/19 1133 05/29/19 1653  WBC 17.1* 18.3*  NEUTROABS 13.7*  --   HGB 19.2* 16.8  HCT 58.2* 49.6  MCV 88.3 87.0  PLT 451* 308   Basic Metabolic Panel: Recent Labs  Lab 05/30/19 0258 05/30/19 1551 05/31/19 0255 05/31/19 0952 05/31/19 1544 05/31/19 2140 06/01/19 0224 06/02/19 0211  NA 140 141 140 141 140 134*  --  138  K 4.0 3.6 3.6 3.5 3.2* 3.1*  --  3.5  CL 111 114* 113* 113* 112* 106  --  106  CO2 14* 18* 19* 20* 21* 18*  --  23  GLUCOSE 180* 180* 161* 190* 208* 183*  --  112*  BUN 38* 24* 18 16 16 13   --  10  CREATININE 1.32* 0.96 0.95 0.90 0.81 0.79  --  0.63  CALCIUM 8.6* 8.4* 8.3* 8.2* 8.3* 7.9*  --  8.1*  MG 2.4 2.5* 2.5*  --   --   --  2.1 1.9  PHOS 2.6  --  1.7*  --   --   --  2.4* 2.5   GFR: Estimated Creatinine Clearance: 147.7 mL/min (by C-G formula based on SCr of 0.63 mg/dL). Liver Function Tests: Recent Labs  Lab 05/29/19 1133 05/30/19 0258 05/31/19 0255 06/01/19 0224 06/02/19 0211  AST 16 12* 12* 10* 11*  ALT 22 15 11 11 11   ALKPHOS 95 64 53 54 57  BILITOT 2.0* 0.7 0.7 0.6 0.8  PROT 9.1* 6.1* 6.0* 5.2* 5.1*  ALBUMIN 3.9 2.8* 2.7* 2.3* 2.1*   No results for input(s): LIPASE, AMYLASE in the last 168 hours. No results for input(s): AMMONIA in the last 168 hours. Coagulation Profile: No results for input(s): INR, PROTIME in the last 168 hours. Cardiac Enzymes: No results for input(s): CKTOTAL, CKMB, CKMBINDEX, TROPONINI in the last 168 hours. BNP (last 3 results) No results for input(s): PROBNP in the last 8760  hours. HbA1C: No results for input(s): HGBA1C in the last 72 hours. CBG: Recent Labs  Lab 06/01/19 0845 06/01/19 1208 06/01/19 1647 06/01/19 2159 06/02/19 0816  GLUCAP 94 105* 128* 95 128*   Lipid Profile: No results for input(s): CHOL, HDL, LDLCALC, TRIG, CHOLHDL, LDLDIRECT in the last 72 hours. Thyroid Function Tests: No results for input(s): TSH, T4TOTAL, FREET4, T3FREE, THYROIDAB in the last 72 hours. Anemia Panel: Recent Labs    06/01/19 0224 06/02/19 0211  FERRITIN 1,240* 1,131*   Sepsis Labs: Recent Labs  Lab 05/29/19 1133 05/29/19 1136 05/29/19 1336 05/29/19 1850  PROCALCITON <0.10  --   --   --   LATICACIDVEN  --  3.8* 2.7* 2.8*    Recent Results (from the past 240 hour(s))  Blood Culture (routine x 2)     Status: None (Preliminary result)   Collection Time: 05/29/19 11:36 AM   Specimen: BLOOD  Result Value Ref Range Status  Specimen Description   Final    BLOOD LEFT ANTECUBITAL Performed at [facility] [address], [place], [place] [address]    Special Requests   Final    BOTTLES DRAWN AEROBIC AND ANAEROBIC Blood Culture adequate volume Performed at [facility] [address], [place], [place] [address]    Culture   Final    NO GROWTH 3 DAYS Performed at [facility], [place] [address] [place], [place] [address]    Report Status PENDING  Incomplete  Blood Culture (routine x 2)     Status: None (Preliminary result)   Collection Time: 05/29/19 11:41 AM   Specimen: BLOOD  Result Value Ref Range Status   Specimen Description   Final    BLOOD SITE NOT SPECIFIED Performed at [facility] [address] [place], [place] [address]    Special Requests   Final    BOTTLES DRAWN AEROBIC AND ANAEROBIC Blood Culture adequate volume Performed at [facility] [address] [place], [place] [address]    Culture   Final    NO GROWTH 3 DAYS Performed at [facility], [place] [address] [place], [place] [address]    Report Status PENDING  Incomplete  MRSA PCR Screening     Status: None   Collection Time: 05/29/19  9:23 PM   Specimen: Nasal Mucosa; Nasopharyngeal  Result Value Ref Range Status   MRSA by PCR NEGATIVE NEGATIVE Final    Comment:        The GeneXpert MRSA Assay (FDA approved for NASAL specimens only), is one component of a comprehensive MRSA colonization surveillance program. It is not intended to diagnose MRSA infection nor to guide or monitor treatment for MRSA infections. Performed at [facility], [place] [address], [place],  [address]          Radiology Studies: No results found.      Scheduled Meds: . Chlorhexidine Gluconate Cloth  6 each Topical Daily  . cloNIDine  0.1 mg Oral TID  . enoxaparin (LOVENOX) injection  40 mg Subcutaneous Q24H  . insulin aspart  0-5 Units Subcutaneous QHS  . insulin aspart  0-9 Units Subcutaneous TID WC  . insulin detemir  18 Units Subcutaneous Daily  . mouth rinse  15 mL Mouth Rinse BID  . potassium chloride  40 mEq Oral BID   Continuous Infusions: . lactated ringers 150 mL/hr at 06/02/19 0946     LOS: 4 days    Time spent: 35 minutes spent on chart review, discussion with nursing staff, consultants, updating family and interview/physical exam; more than 50% of that time was spent in counseling and/or coordination of care.    Danisa Kopec J [place] DO [facility] 06/02/2019, 10:37 AM

## 2019-06-03 DIAGNOSIS — I10 Essential (primary) hypertension: Secondary | ICD-10-CM

## 2019-06-03 LAB — HEPATIC FUNCTION PANEL
ALT: 11 U/L (ref 0–44)
AST: 11 U/L — ABNORMAL LOW (ref 15–41)
Albumin: 2.1 g/dL — ABNORMAL LOW (ref 3.5–5.0)
Alkaline Phosphatase: 58 U/L (ref 38–126)
Bilirubin, Direct: 0.1 mg/dL (ref 0.0–0.2)
Indirect Bilirubin: 0.3 mg/dL (ref 0.3–0.9)
Total Bilirubin: 0.4 mg/dL (ref 0.3–1.2)
Total Protein: 5.2 g/dL — ABNORMAL LOW (ref 6.5–8.1)

## 2019-06-03 LAB — MAGNESIUM: Magnesium: 1.8 mg/dL (ref 1.7–2.4)

## 2019-06-03 LAB — C-REACTIVE PROTEIN: CRP: 7.9 mg/dL — ABNORMAL HIGH (ref <1.0)

## 2019-06-03 LAB — CULTURE, BLOOD (ROUTINE X 2)
Culture: NO GROWTH
Culture: NO GROWTH
Special Requests: ADEQUATE
Special Requests: ADEQUATE

## 2019-06-03 LAB — FERRITIN: Ferritin: 948 ng/mL — ABNORMAL HIGH (ref 24–336)

## 2019-06-03 LAB — D-DIMER, QUANTITATIVE: D-Dimer, Quant: 2.02 ug/mL-FEU — ABNORMAL HIGH (ref 0.00–0.50)

## 2019-06-03 LAB — GLUCOSE, CAPILLARY
Glucose-Capillary: 151 mg/dL — ABNORMAL HIGH (ref 70–99)
Glucose-Capillary: 227 mg/dL — ABNORMAL HIGH (ref 70–99)

## 2019-06-03 LAB — PHOSPHORUS: Phosphorus: 3.3 mg/dL (ref 2.5–4.6)

## 2019-06-03 MED ORDER — "PEN NEEDLES 3/16"" 31G X 5 MM MISC": 0 refills | Status: DC

## 2019-06-03 MED ORDER — INSULIN DETEMIR 100 UNIT/ML ~~LOC~~ SOLN
22.0000 [IU] | Freq: Every day | SUBCUTANEOUS | Status: DC
  Administered 2019-06-03: 09:00:00 22 [IU] via SUBCUTANEOUS
  Filled 2019-06-03: qty 0.22

## 2019-06-03 MED ORDER — INSULIN DETEMIR 100 UNIT/ML FLEXPEN: 22.0000 [IU] | PEN_INJECTOR | Freq: Every day | SUBCUTANEOUS | 0 refills | Status: DC

## 2019-06-03 NOTE — Discharge Instructions (Signed)
Blood Glucose Monitoring, Adult Monitoring your blood sugar (glucose) is an important part of managing your diabetes (diabetes mellitus). Blood glucose monitoring involves checking your blood glucose as often as directed and keeping a record (log) of your results over time. Checking your blood glucose regularly and keeping a blood glucose log can:  Help you and your health care provider adjust your diabetes management plan as needed, including your medicines or insulin.  Help you understand how food, exercise, illnesses, and medicines affect your blood glucose.  Let you know what your blood glucose is at any time. You can quickly find out if you have low blood glucose (hypoglycemia) or high blood glucose (hyperglycemia). Your health care provider will set individualized treatment goals for you. Your goals will be based on your age, other medical conditions you have, and how you respond to diabetes treatment. Generally, the goal of treatment is to maintain the following blood glucose levels:  Before meals (preprandial): 80-130 mg/dL (4.4-7.2 mmol/L).  After meals (postprandial): below 180 mg/dL (10 mmol/L).  A1c level: less than 7%. Supplies needed:  Blood glucose meter.  Test strips for your meter. Each meter has its own strips. You must use the strips that came with your meter.  A needle to prick your finger (lancet). Do not use a lancet more than one time.  A device that holds the lancet (lancing device).  A journal or log book to write down your results. How to check your blood glucose  1. Wash your hands with soap and water. 2. Prick the side of your finger (not the tip) with the lancet. Use a different finger each time. 3. Gently rub the finger until a small drop of blood appears. 4. Follow instructions that come with your meter for inserting the test strip, applying blood to the strip, and using your blood glucose meter. 5. Write down your result and any notes. Some meters  allow you to use areas of your body other than your finger (alternative sites) to test your blood. The most common alternative sites are:  Forearm.  Thigh.  Palm of the hand. If you think you may have hypoglycemia, or if you have a history of not knowing when your blood glucose is getting low (hypoglycemia unawareness), do not use alternative sites. Use your finger instead. Alternative sites may not be as accurate as the fingers, because blood flow is slower in these areas. This means that the result you get may be delayed, and it may be different from the result that you would get from your finger. Follow these instructions at home: Blood glucose log   Every time you check your blood glucose, write down your result. Also write down any notes about things that may be affecting your blood glucose, such as your diet and exercise for the day. This information can help you and your health care provider: ? Look for patterns in your blood glucose over time. ? Adjust your diabetes management plan as needed.  Check if your meter allows you to download your records to a computer. Most glucose meters store a record of glucose readings in the meter. If you have type 1 diabetes:  Check your blood glucose 2 or more times a day.  Also check your blood glucose: ? Before every insulin injection. ? Before and after exercise. ? Before meals. ? 2 hours after a meal. ? Occasionally between 2:00 a.m. and 3:00 a.m., as directed. ? Before potentially dangerous tasks, like driving or using heavy machinery. ?   At bedtime.  You may need to check your blood glucose more often, up to 6-10 times a day, if you: ? Use an insulin pump. ? Need multiple daily injections (MDI). ? Have diabetes that is not well-controlled. ? Are ill. ? Have a history of severe hypoglycemia. ? Have hypoglycemia unawareness. If you have type 2 diabetes:  If you take insulin or other diabetes medicines, check your blood glucose 2 or  more times a day.  If you are on intensive insulin therapy, check your blood glucose 4 or more times a day. Occasionally, you may also need to check between 2:00 a.m. and 3:00 a.m., as directed.  Also check your blood glucose: ? Before and after exercise. ? Before potentially dangerous tasks, like driving or using heavy machinery.  You may need to check your blood glucose more often if: ? Your medicine is being adjusted. ? Your diabetes is not well-controlled. ? You are ill. General tips  Always keep your supplies with you.  If you have questions or need help, all blood glucose meters have a 24-hour "hotline" phone number that you can call. You may also contact your health care provider.  After you use a few boxes of test strips, adjust (calibrate) your blood glucose meter by following instructions that came with your meter. Contact a health care provider if:  Your blood glucose is at or above 240 mg/dL (09.8 mmol/L) for 2 days in a row.  You have been sick or have had a fever for 2 days or longer, and you are not getting better.  You have any of the following problems for more than 6 hours: ? You cannot eat or drink. ? You have nausea or vomiting. ? You have diarrhea. Get help right away if:  Your blood glucose is lower than 54 mg/dL (3 mmol/L).  You become confused or you have trouble thinking clearly.  You have difficulty breathing.  You have moderate or large ketone levels in your urine. Summary  Monitoring your blood sugar (glucose) is an important part of managing your diabetes (diabetes mellitus).  Blood glucose monitoring involves checking your blood glucose as often as directed and keeping a record (log) of your results over time.  Your health care provider will set individualized treatment goals for you. Your goals will be based on your age, other medical conditions you have, and how you respond to diabetes treatment.  Every time you check your blood glucose,  write down your result. Also write down any notes about things that may be affecting your blood glucose, such as your diet and exercise for the day. This information is not intended to replace advice given to you by your health care provider. Make sure you discuss any questions you have with your health care provider. Document Released: 05/25/2003 Document Revised: 03/15/2018 Document Reviewed: 11/01/2015 Elsevier Patient Education  2020 [employer].   COVID-19 Frequently Asked Questions COVID-19 (coronavirus disease) is an infection that is caused by a large family of viruses. Some viruses cause illness in people and others cause illness in animals like camels, cats, and bats. In some cases, the viruses that cause illness in animals can spread to humans. Where did the coronavirus come from? In December 2019, [place] told the [facility]) of several cases of lung disease (human respiratory illness). These cases were linked to an open seafood and livestock market in the city of [place]. The link to the seafood and livestock market suggests that the virus may  have spread from animals to humans. However, since that first outbreak in December, the virus has also been shown to spread from person to person. What is the name of the disease and the virus? Disease name Early on, this disease was called novel coronavirus. This is because scientists determined that the disease was caused by a new (novel) respiratory virus. The World Health Organization [facility]) has now named the disease COVID-19, or coronavirus disease. Virus name The virus that causes the disease is called severe acute respiratory syndrome coronavirus 2 (SARS-CoV-2). More information on disease and virus naming World Health Organization [facility]): www.who.int/emergencies/diseases/novel-coronavirus-2019/technical-guidance/naming-the-coronavirus-disease-(covid-2019)-and-the-virus-that-causes-it Who is at risk for complications from  coronavirus disease? Some people may be at higher risk for complications from coronavirus disease. This includes older adults and people who have chronic diseases, such as heart disease, diabetes, and lung disease. If you are at higher risk for complications, take these extra precautions:  Avoid close contact with people who are sick or have a fever or cough. Stay at least 3-6 ft (1-2 m) away from them, if possible.  Wash your hands often with soap and water for at least 20 seconds.  Avoid touching your face, mouth, nose, or eyes.  Keep supplies on hand at home, such as food, medicine, and cleaning supplies.  Stay home as much as possible.  Avoid social gatherings and travel. How does coronavirus disease spread? The virus that causes coronavirus disease spreads easily from person to person (is contagious). There are also cases of community-spread disease. This means the disease has spread to:  People who have no known contact with other infected people.  People who have not traveled to areas where there are known cases. It appears to spread from one person to another through droplets from coughing or sneezing. Can I get the virus from touching surfaces or objects? There is still a lot that we do not know about the virus that causes coronavirus disease. Scientists are basing a lot of information on what they know about similar viruses, such as:  Viruses cannot generally survive on surfaces for long. They need a human body (host) to survive.  It is more likely that the virus is spread by close contact with people who are sick (direct contact), such as through: ? Shaking hands or hugging. ? Breathing in respiratory droplets that travel through the air. This can happen when an infected person coughs or sneezes on or near other people.  It is less likely that the virus is spread when a person touches a surface or object that has the virus on it (indirect contact). The virus may be able to  enter the body if the person touches a surface or object and then touches his or her face, eyes, nose, or mouth. Can a person spread the virus without having symptoms of the disease? It may be possible for the virus to spread before a person has symptoms of the disease, but this is most likely not the main way the virus is spreading. It is more likely for the virus to spread by being in close contact with people who are sick and breathing in the respiratory droplets of a sick person's cough or sneeze. What are the symptoms of coronavirus disease? Symptoms vary from person to person and can range from mild to severe. Symptoms may include:  Fever.  Cough.  Tiredness, weakness, or fatigue.  Fast breathing or feeling short of breath. These symptoms can appear anywhere from 2 to 14 days after you have  been exposed to the virus. If you develop symptoms, call your health care provider. People with severe symptoms may need hospital care. If I am exposed to the virus, how long does it take before symptoms start? Symptoms of coronavirus disease may appear anywhere from 2 to 14 days after a person has been exposed to the virus. If you develop symptoms, call your health care provider. Should I be tested for this virus? Your health care provider will decide whether to test you based on your symptoms, history of exposure, and your risk factors. How does a health care provider test for this virus? Health care providers will collect samples to send for testing. Samples may include:  Taking a swab of fluid from the nose.  Taking fluid from the lungs by having you cough up mucus (sputum) into a sterile cup.  Taking a blood sample.  Taking a stool or urine sample. Is there a treatment or vaccine for this virus? Currently, there is no vaccine to prevent coronavirus disease. Also, there are no medicines like antibiotics or antivirals to treat the virus. A person who becomes sick is given supportive care,  which means rest and fluids. A person may also relieve his or her symptoms by using over-the-counter medicines that treat sneezing, coughing, and runny nose. These are the same medicines that a person takes for the common cold. If you develop symptoms, call your health care provider. People with severe symptoms may need hospital care. What can I do to protect myself and my family from this virus?     You can protect yourself and your family by taking the same actions that you would take to prevent the spread of other viruses. Take the following actions:  Wash your hands often with soap and water for at least 20 seconds. If soap and water are not available, use alcohol-based hand sanitizer.  Avoid touching your face, mouth, nose, or eyes.  Cough or sneeze into a tissue, sleeve, or elbow. Do not cough or sneeze into your hand or the air. ? If you cough or sneeze into a tissue, throw it away immediately and wash your hands.  Disinfect objects and surfaces that you frequently touch every day.  Avoid close contact with people who are sick or have a fever or cough. Stay at least 3-6 ft (1-2 m) away from them, if possible.  Stay home if you are sick, except to get medical care. Call your health care provider before you get medical care.  Make sure your vaccines are up to date. Ask your health care provider what vaccines you need. What should I do if I need to travel? Follow travel recommendations from your local health authority, the CDC, and WHO. Travel information and advice  Centers for Disease Control and Prevention (CDC): GeminiCard.gl  World Health Organization [facility]): PreviewDomains.se Know the risks and take action to protect your health  You are at higher risk of getting coronavirus disease if you are traveling to areas with an outbreak or if you are exposed to travelers from areas with an  outbreak.  Wash your hands often and practice good hygiene to lower the risk of catching or spreading the virus. What should I do if I am sick? General instructions to stop the spread of infection  Wash your hands often with soap and water for at least 20 seconds. If soap and water are not available, use alcohol-based hand sanitizer.  Cough or sneeze into a tissue, sleeve, or elbow. Do not  cough or sneeze into your hand or the air.  If you cough or sneeze into a tissue, throw it away immediately and wash your hands.  Stay home unless you must get medical care. Call your health care provider or local health authority before you get medical care.  Avoid public areas. Do not take public transportation, if possible.  If you can, wear a mask if you must go out of the house or if you are in close contact with someone who is not sick. Keep your home clean  Disinfect objects and surfaces that are frequently touched every day. This may include: ? Counters and tables. ? Doorknobs and light switches. ? Sinks and faucets. ? Electronics such as phones, remote controls, keyboards, computers, and tablets.  Wash dishes in hot, soapy water or use a dishwasher. Air-dry your dishes.  Wash laundry in hot water. Prevent infecting other household members  Let healthy household members care for children and pets, if possible. If you have to care for children or pets, wash your hands often and wear a mask.  Sleep in a different bedroom or bed, if possible.  Do not share personal items, such as razors, toothbrushes, deodorant, combs, brushes, towels, and washcloths. Where to find more information Centers for Disease Control and Prevention (CDC)  Information and news updates: CardRetirement.cz World Health Organization [facility])  Information and news updates: AffordableSalon.es  Coronavirus health topic:  https://thompson-craig.com/  Questions and answers on COVID-19: kruiseway.com  Global tracker: who.sprinklr.com American Academy of Pediatrics (AAP)  Information for families: www.healthychildren.org/English/health-issues/conditions/chest-lungs/Pages/2019-Novel-Coronavirus.aspx The coronavirus situation is changing rapidly. Check your local health authority website or the CDC and [facility] websites for updates and news. When should I contact a health care provider?  Contact your health care provider if you have symptoms of an infection, such as fever or cough, and you: ? Have been near anyone who is known to have coronavirus disease. ? Have come into contact with a person who is suspected to have coronavirus disease. ? Have traveled outside of the country. When should I get emergency medical care?  Get help right away by calling your local emergency services (911 in the [place] if you have: ? Trouble breathing. ? Pain or pressure in your chest. ? Confusion. ? Blue-tinged lips and fingernails. ? Difficulty waking from sleep. ? Symptoms that get worse. Let the emergency medical personnel know if you think you have coronavirus disease. Summary  A new respiratory virus is spreading from person to person and causing COVID-19 (coronavirus disease).  The virus that causes COVID-19 appears to spread easily. It spreads from one person to another through droplets from coughing or sneezing.  Older adults and those with chronic diseases are at higher risk of disease. If you are at higher risk for complications, take extra precautions.  There is currently no vaccine to prevent coronavirus disease. There are no medicines, such as antibiotics or antivirals, to treat the virus.  You can protect yourself and your family by washing your hands often, avoiding touching your face, and covering your coughs and sneezes. This information is not intended to replace  advice given to you by your health care provider. Make sure you discuss any questions you have with your health care provider. Document Released: 09/17/2018 Document Revised: 09/17/2018 Document Reviewed: 09/17/2018 Elsevier Patient Education  2020 [employer].   COVID-19 COVID-19 is a respiratory infection that is caused by a virus called severe acute respiratory syndrome coronavirus 2 (SARS-CoV-2). The disease is also known as coronavirus  disease or novel coronavirus. In some people, the virus may not cause any symptoms. In others, it may cause a serious infection. The infection can get worse quickly and can lead to complications, such as:  Pneumonia, or infection of the lungs.  Acute respiratory distress syndrome or ARDS. This is fluid build-up in the lungs.  Acute respiratory failure. This is a condition in which there is not enough oxygen passing from the lungs to the body.  Sepsis or septic shock. This is a serious bodily reaction to an infection.  Blood clotting problems.  Secondary infections due to bacteria or fungus. The virus that causes COVID-19 is contagious. This means that it can spread from person to person through droplets from coughs and sneezes (respiratory secretions). What are the causes? This illness is caused by a virus. You may catch the virus by:  Breathing in droplets from an infected person's cough or sneeze.  Touching something, like a table or a doorknob, that was exposed to the virus (contaminated) and then touching your mouth, nose, or eyes. What increases the risk? Risk for infection You are more likely to be infected with this virus if you:  Live in or travel to an area with a COVID-19 outbreak.  Come in contact with a sick person who recently traveled to an area with a COVID-19 outbreak.  Provide care for or live with a person who is infected with COVID-19. Risk for serious illness You are more likely to become seriously ill from the virus if  you:  Are 13 years of age or older.  Have a long-term disease that lowers your body's ability to fight infection (immunocompromised).  Live in a nursing home or long-term care facility.  Have a long-term (chronic) disease such as: ? Chronic lung disease, including chronic obstructive pulmonary disease or asthma ? Heart disease. ? Diabetes. ? Chronic kidney disease. ? Liver disease.  Are obese. What are the signs or symptoms? Symptoms of this condition can range from mild to severe. Symptoms may appear any time from 2 to 14 days after being exposed to the virus. They include:  A fever.  A cough.  Difficulty breathing.  Chills.  Muscle pains.  A sore throat.  Loss of taste or smell. Some people may also have stomach problems, such as nausea, vomiting, or diarrhea. Other people may not have any symptoms of COVID-19. How is this diagnosed? This condition may be diagnosed based on:  Your signs and symptoms, especially if: ? You live in an area with a COVID-19 outbreak. ? You recently traveled to or from an area where the virus is common. ? You provide care for or live with a person who was diagnosed with COVID-19.  A physical exam.  Lab tests, which may include: ? A nasal swab to take a sample of fluid from your nose. ? A throat swab to take a sample of fluid from your throat. ? A sample of mucus from your lungs (sputum). ? Blood tests.  Imaging tests, which may include, X-rays, CT scan, or ultrasound. How is this treated? At present, there is no medicine to treat COVID-19. Medicines that treat other diseases are being used on a trial basis to see if they are effective against COVID-19. Your health care provider will talk with you about ways to treat your symptoms. For most people, the infection is mild and can be managed at home with rest, fluids, and over-the-counter medicines. Treatment for a serious infection usually takes places in a  hospital intensive care unit  (ICU). It may include one or more of the following treatments. These treatments are given until your symptoms improve.  Receiving fluids and medicines through an IV.  Supplemental oxygen. Extra oxygen is given through a tube in the nose, a face mask, or a hood.  Positioning you to lie on your stomach (prone position). This makes it easier for oxygen to get into the lungs.  Continuous positive airway pressure (CPAP) or bi-level positive airway pressure (BPAP) machine. This treatment uses mild air pressure to keep the airways open. A tube that is connected to a motor delivers oxygen to the body.  Ventilator. This treatment moves air into and out of the lungs by using a tube that is placed in your windpipe.  Tracheostomy. This is a procedure to create a hole in the neck so that a breathing tube can be inserted.  Extracorporeal membrane oxygenation (ECMO). This procedure gives the lungs a chance to recover by taking over the functions of the heart and lungs. It supplies oxygen to the body and removes carbon dioxide. Follow these instructions at home: Lifestyle  If you are sick, stay home except to get medical care. Your health care provider will tell you how long to stay home. Call your health care provider before you go for medical care.  Rest at home as told by your health care provider.  Do not use any products that contain nicotine or tobacco, such as cigarettes, e-cigarettes, and chewing tobacco. If you need help quitting, ask your health care provider.  Return to your normal activities as told by your health care provider. Ask your health care provider what activities are safe for you. General instructions  Take over-the-counter and prescription medicines only as told by your health care provider.  Drink enough fluid to keep your urine pale yellow.  Keep all follow-up visits as told by your health care provider. This is important. How is this prevented?  There is no vaccine to  help prevent COVID-19 infection. However, there are steps you can take to protect yourself and others from this virus. To protect yourself:   Do not travel to areas where COVID-19 is a risk. The areas where COVID-19 is reported change often. To identify high-risk areas and travel restrictions, check the CDC travel website: StageSync.si  If you live in, or must travel to, an area where COVID-19 is a risk, take precautions to avoid infection. ? Stay away from people who are sick. ? Wash your hands often with soap and water for 20 seconds. If soap and water are not available, use an alcohol-based hand sanitizer. ? Avoid touching your mouth, face, eyes, or nose. ? Avoid going out in public, follow guidance from your state and local health authorities. ? If you must go out in public, wear a cloth face covering or face mask. ? Disinfect objects and surfaces that are frequently touched every day. This may include:  Counters and tables.  Doorknobs and light switches.  Sinks and faucets.  Electronics, such as phones, remote controls, keyboards, computers, and tablets. To protect others: If you have symptoms of COVID-19, take steps to prevent the virus from spreading to others.  If you think you have a COVID-19 infection, contact your health care provider right away. Tell your health care team that you think you may have a COVID-19 infection.  Stay home. Leave your house only to seek medical care. Do not use public transport.  Do not travel while you are  sick.  Wash your hands often with soap and water for 20 seconds. If soap and water are not available, use alcohol-based hand sanitizer.  Stay away from other members of your household. Let healthy household members care for children and pets, if possible. If you have to care for children or pets, wash your hands often and wear a mask. If possible, stay in your own room, separate from others. Use a different bathroom.  Make sure  that all people in your household wash their hands well and often.  Cough or sneeze into a tissue or your sleeve or elbow. Do not cough or sneeze into your hand or into the air.  Wear a cloth face covering or face mask. Where to find more information  Centers for Disease Control and Prevention: StickerEmporium.tn  World Health Organization: https://thompson-craig.com/ Contact a health care provider if:  You live in or have traveled to an area where COVID-19 is a risk and you have symptoms of the infection.  You have had contact with someone who has COVID-19 and you have symptoms of the infection. Get help right away if:  You have trouble breathing.  You have pain or pressure in your chest.  You have confusion.  You have bluish lips and fingernails.  You have difficulty waking from sleep.  You have symptoms that get worse. These symptoms may represent a serious problem that is an emergency. Do not wait to see if the symptoms will go away. Get medical help right away. Call your local emergency services (911 in the [place] Do not drive yourself to the hospital. Let the emergency medical personnel know if you think you have COVID-19. Summary  COVID-19 is a respiratory infection that is caused by a virus. It is also known as coronavirus disease or novel coronavirus. It can cause serious infections, such as pneumonia, acute respiratory distress syndrome, acute respiratory failure, or sepsis.  The virus that causes COVID-19 is contagious. This means that it can spread from person to person through droplets from coughs and sneezes.  You are more likely to develop a serious illness if you are 46 years of age or older, have a weak immunity, live in a nursing home, or have chronic disease.  There is no medicine to treat COVID-19. Your health care provider will talk with you about ways to treat your symptoms.  Take steps to protect yourself and others  from infection. Wash your hands often and disinfect objects and surfaces that are frequently touched every day. Stay away from people who are sick and wear a mask if you are sick. This information is not intended to replace advice given to you by your health care provider. Make sure you discuss any questions you have with your health care provider. Document Released: 06/27/2018 Document Revised: 10/17/2018 Document Reviewed: 06/27/2018 Elsevier Patient Education  2020 Elsevier Inc.  COVID-19: How to Protect Yourself and Others Know how it spreads  There is currently no vaccine to prevent coronavirus disease 2019 (COVID-19).  The best way to prevent illness is to avoid being exposed to this virus.  The virus is thought to spread mainly from person-to-person. ? Between people who are in close contact with one another (within about 6 feet). ? Through respiratory droplets produced when an infected person coughs, sneezes or talks. ? These droplets can land in the mouths or noses of people who are nearby or possibly be inhaled into the lungs. ? Some recent studies have suggested that COVID-19 may be spread  by people who are not showing symptoms. Everyone should Clean your hands often  Wash your hands often with soap and water for at least 20 seconds especially after you have been in a public place, or after blowing your nose, coughing, or sneezing.  If soap and water are not readily available, use a hand sanitizer that contains at least 60% alcohol. Cover all surfaces of your hands and rub them together until they feel dry.  Avoid touching your eyes, nose, and mouth with unwashed hands. Avoid close contact  Stay home if you are sick.  Avoid close contact with people who are sick.  Put distance between yourself and other people. ? Remember that some people without symptoms may be able to spread virus. ? This is especially important for people who are at higher risk of getting very  RetroStamps.it Cover your mouth and nose with a cloth face cover when around others  You could spread COVID-19 to others even if you do not feel sick.  Everyone should wear a cloth face cover when they have to go out in public, for example to the grocery store or to pick up other necessities. ? Cloth face coverings should not be placed on young children under age 14, anyone who has trouble breathing, or is unconscious, incapacitated or otherwise unable to remove the mask without assistance.  The cloth face cover is meant to protect other people in case you are infected.  Do NOT use a facemask meant for a Research scientist (physical sciences).  Continue to keep about 6 feet between yourself and others. The cloth face cover is not a substitute for social distancing. Cover coughs and sneezes  If you are in a private setting and do not have on your cloth face covering, remember to always cover your mouth and nose with a tissue when you cough or sneeze or use the inside of your elbow.  Throw used tissues in the trash.  Immediately wash your hands with soap and water for at least 20 seconds. If soap and water are not readily available, clean your hands with a hand sanitizer that contains at least 60% alcohol. Clean and disinfect  Clean AND disinfect frequently touched surfaces daily. This includes tables, doorknobs, light switches, countertops, handles, desks, phones, keyboards, toilets, faucets, and sinks. ktimeonline.com  If surfaces are dirty, clean them: Use detergent or soap and water prior to disinfection.  Then, use a household disinfectant. You can see a list of EPA-registered household disinfectants here. [employer] 10/08/2018 This information is not intended to replace advice given to you by your health care provider. Make sure you discuss any questions you  have with your health care provider. Document Released: 09/17/2018 Document Revised: 10/16/2018 Document Reviewed: 09/17/2018 Elsevier Patient Education  2020 Elsevier Inc.  10 Things You Can Do to Manage Your COVID-19 Symptoms at Home If you have possible or confirmed COVID-19: 1. Stay home from work and school. And stay away from other public places. If you must go out, avoid using any kind of public transportation, ridesharing, or taxis. 2. Monitor your symptoms carefully. If your symptoms get worse, call your healthcare provider immediately. 3. Get rest and stay hydrated. 4. If you have a medical appointment, call the healthcare provider ahead of time and tell them that you have or may have COVID-19. 5. For medical emergencies, call 911 and notify the dispatch personnel that you have or may have COVID-19. 6. Cover your cough and sneezes with a tissue or use the inside of  your elbow. 7. Wash your hands often with soap and water for at least 20 seconds or clean your hands with an alcohol-based hand sanitizer that contains at least 60% alcohol. 8. As much as possible, stay in a specific room and away from other people in your home. Also, you should use a separate bathroom, if available. If you need to be around other people in or outside of the home, wear a mask. 9. Avoid sharing personal items with other people in your household, like dishes, towels, and bedding. 10. Clean all surfaces that are touched often, like counters, tabletops, and doorknobs. Use household cleaning sprays or wipes according to the label instructions. [employer] 12/04/2018 This information is not intended to replace advice given to you by your health care provider. Make sure you discuss any questions you have with your health care provider. Document Revised: 05/08/2019 Document Reviewed: 05/08/2019 Elsevier Patient Education  2020 Elsevier Inc.  Blood Glucose Monitoring, Adult Monitoring your blood sugar  (glucose) is an important part of managing your diabetes (diabetes mellitus). Blood glucose monitoring involves checking your blood glucose as often as directed and keeping a record (log) of your results over time. Checking your blood glucose regularly and keeping a blood glucose log can:  Help you and your health care provider adjust your diabetes management plan as needed, including your medicines or insulin.  Help you understand how food, exercise, illnesses, and medicines affect your blood glucose.  Let you know what your blood glucose is at any time. You can quickly find out if you have low blood glucose (hypoglycemia) or high blood glucose (hyperglycemia). Your health care provider will set individualized treatment goals for you. Your goals will be based on your age, other medical conditions you have, and how you respond to diabetes treatment. Generally, the goal of treatment is to maintain the following blood glucose levels:  Before meals (preprandial): 80-130 mg/dL (1.6-1.0 mmol/L).  After meals (postprandial): below 180 mg/dL (10 mmol/L).  A1c level: less than 7%. Supplies needed:  Blood glucose meter.  Test strips for your meter. Each meter has its own strips. You must use the strips that came with your meter.  A needle to prick your finger (lancet). Do not use a lancet more than one time.  A device that holds the lancet (lancing device).  A journal or log book to write down your results. How to check your blood glucose  6. Wash your hands with soap and water. 7. Prick the side of your finger (not the tip) with the lancet. Use a different finger each time. 8. Gently rub the finger until a small drop of blood appears. 9. Follow instructions that come with your meter for inserting the test strip, applying blood to the strip, and using your blood glucose meter. 10. Write down your result and any notes. Some meters allow you to use areas of your body other than your finger  (alternative sites) to test your blood. The most common alternative sites are:  Forearm.  Thigh.  Palm of the hand. If you think you may have hypoglycemia, or if you have a history of not knowing when your blood glucose is getting low (hypoglycemia unawareness), do not use alternative sites. Use your finger instead. Alternative sites may not be as accurate as the fingers, because blood flow is slower in these areas. This means that the result you get may be delayed, and it may be different from the result that you would get from your finger.  Follow these instructions at home: Blood glucose log   Every time you check your blood glucose, write down your result. Also write down any notes about things that may be affecting your blood glucose, such as your diet and exercise for the day. This information can help you and your health care provider: ? Look for patterns in your blood glucose over time. ? Adjust your diabetes management plan as needed.  Check if your meter allows you to download your records to a computer. Most glucose meters store a record of glucose readings in the meter. If you have type 1 diabetes:  Check your blood glucose 2 or more times a day.  Also check your blood glucose: ? Before every insulin injection. ? Before and after exercise. ? Before meals. ? 2 hours after a meal. ? Occasionally between 2:00 a.m. and 3:00 a.m., as directed. ? Before potentially dangerous tasks, like driving or using heavy machinery. ? At bedtime.  You may need to check your blood glucose more often, up to 6-10 times a day, if you: ? Use an insulin pump. ? Need multiple daily injections (MDI). ? Have diabetes that is not well-controlled. ? Are ill. ? Have a history of severe hypoglycemia. ? Have hypoglycemia unawareness. If you have type 2 diabetes:  If you take insulin or other diabetes medicines, check your blood glucose 2 or more times a day.  If you are on intensive insulin  therapy, check your blood glucose 4 or more times a day. Occasionally, you may also need to check between 2:00 a.m. and 3:00 a.m., as directed.  Also check your blood glucose: ? Before and after exercise. ? Before potentially dangerous tasks, like driving or using heavy machinery.  You may need to check your blood glucose more often if: ? Your medicine is being adjusted. ? Your diabetes is not well-controlled. ? You are ill. General tips  Always keep your supplies with you.  If you have questions or need help, all blood glucose meters have a 24-hour "hotline" phone number that you can call. You may also contact your health care provider.  After you use a few boxes of test strips, adjust (calibrate) your blood glucose meter by following instructions that came with your meter. Contact a health care provider if:  Your blood glucose is at or above 240 mg/dL (16.1 mmol/L) for 2 days in a row.  You have been sick or have had a fever for 2 days or longer, and you are not getting better.  You have any of the following problems for more than 6 hours: ? You cannot eat or drink. ? You have nausea or vomiting. ? You have diarrhea. Get help right away if:  Your blood glucose is lower than 54 mg/dL (3 mmol/L).  You become confused or you have trouble thinking clearly.  You have difficulty breathing.  You have moderate or large ketone levels in your urine. Summary  Monitoring your blood sugar (glucose) is an important part of managing your diabetes (diabetes mellitus).  Blood glucose monitoring involves checking your blood glucose as often as directed and keeping a record (log) of your results over time.  Your health care provider will set individualized treatment goals for you. Your goals will be based on your age, other medical conditions you have, and how you respond to diabetes treatment.  Every time you check your blood glucose, write down your result. Also write down any notes  about things that may be affecting your  blood glucose, such as your diet and exercise for the day. This information is not intended to replace advice given to you by your health care provider. Make sure you discuss any questions you have with your health care provider. Document Released: 05/25/2003 Document Revised: 03/15/2018 Document Reviewed: 11/01/2015 Elsevier Patient Education  2020 Elsevier Inc.  Diabetes Mellitus and Sick Day Management Blood sugar (glucose) can be difficult to control when you are sick. Common illnesses that can cause problems for people with diabetes (diabetes mellitus) include colds, fever, flu (influenza), nausea, vomiting, and diarrhea. These illnesses can cause stress and loss of body fluids (dehydration), and those issues can cause blood glucose levels to increase. Because of this, it is very important to take your insulin and diabetes medicines and eat some form of carbohydrate when you are sick. You should make a plan for days when you are sick (sick day plan) as part of your diabetes management plan. You and your health care provider should make this plan in advance. The following guidelines are intended to help you manage an illness that lasts for about 24 hours or less. Your health care provider may also give you more specific instructions. What do I need to do to manage my blood glucose?   Check your blood glucose every 2-4 hours, or as often as told by your health care provider.  Know your sick day treatment goals. Your target blood glucose levels may be different when you are sick.  If you use insulin, take your usual dose. ? If your blood glucose continues to be too high, you may need to take an additional insulin dose as told by your health care provider.  If you use oral diabetes medicine, you may need to stop taking it if you are not able to eat or drink normally. Ask your health care provider about whether you need to stop taking these medicines while  you are sick.  If you use injectable hormone medicines other than insulin to control your diabetes, ask your health care provider about whether you need to stop taking these medicines while you are sick. What else can I do to manage my diabetes when I am sick? Check your ketones  If you have type 1 diabetes, check your urine ketones every 4 hours.  If you have type 2 diabetes, check your urine ketones as often as told by your health care provider. Drink fluids  Drink enough fluid to keep your urine clear or pale yellow. This is especially important if you have a fever, vomiting, or diarrhea. Those symptoms can lead to dehydration.  Follow any instructions from your health care provider about beverages to avoid. ? Do not drink alcohol, caffeine, or drinks that contain a lot of sugar. Take medicines as directed  Take-over-the-counter and prescription medicines only as told by your health care provider.  Check medicine labels for added sugars. Some medicines may contain sugar or types of sugars that can raise your blood glucose level. What foods can I eat when I am sick?  You need to eat some form of carbohydrates when you are sick. You should eat 45-50 grams (45-50 g) of carbohydrates every 3-4 hours until you feel better. All of the food choices below contain about 15 g of carbohydrates. Plan ahead and keep some of these foods around so you have them if you get sick.  4-6 oz (120-177 mL) carbonated beverage that contains sugar, such as regular (not diet) soda. You may be able to  drink carbonated beverages more easily if you open the beverage and let it sit at room temperature for a few minutes before drinking.   of a twin frozen ice pop.  4 oz (120 g) regular gelatin.  4 oz (120 mL) fruit juice.  4 oz (120 g) ice cream or frozen yogurt.  2 oz (60 g) sherbet.  8 oz (240 mL) clear broth or soup.  4 oz (120 g) regular custard.  4 oz (120 g) regular pudding.  8 oz (240 g) plain  yogurt.  1 slice bread or toast.  6 saltine crackers.  5 vanilla wafers. Questions to ask your health care provider Consider asking the following questions so you know what to do on days when you are sick:  Should I adjust my diabetes medicines?  How often do I need to check my blood glucose?  What supplies do I need to manage my diabetes at home when I am sick?  What number can I call if I have questions?  What foods and drinks should I avoid? Contact a health care provider if:  You develop symptoms of diabetic ketoacidosis, such as: ? Fatigue. ? Weight loss. ? Excessive thirst. ? Light-headedness. ? Fruity or sweet-smelling breath. ? Excessive urination. ? Vision changes. ? Confusion or irritability. ? Nausea. ? Vomiting. ? Rapid breathing. ? Pain in the abdomen. ? Feeling flushed.  You are unable to drink fluids without vomiting.  You have any of the following for more than 6 hours: ? Nausea. ? Vomiting. ? Diarrhea.  Your blood glucose is at or above 240 mg/dL (21.3 mmol/L), even after you take an additional insulin dose.  You have a change in how you think, feel, or act (mental status).  You develop another serious illness.  You have been sick or have had a fever for 2 days or longer and you are not getting better. Get help right away if:  Your blood glucose is lower than 54 mg/dL (3.0 mmol/L).  You have difficulty breathing.  You have moderate or high ketone levels in your urine.  You used emergency glucagon to treat low blood glucose. Summary  Blood sugar (glucose) can be difficult to control when you are sick. Common illnesses that can cause problems for people with diabetes (diabetes mellitus) include colds, fever, flu (influenza), nausea, vomiting, and diarrhea.  Illnesses can cause stress and loss of body fluids (dehydration), and those issues can cause blood glucose levels to increase.  Make a plan for days when you are sick (sick day plan)  as part of your diabetes management plan. You and your health care provider should make this plan in advance.  It is very important to take your insulin and diabetes medicines and to eat some form of carbohydrate when you are sick.  Contact your health care provider if have problems managing your blood glucose levels when you are sick, or if you have been sick or had a fever for 2 days or longer and are not getting better. This information is not intended to replace advice given to you by your health care provider. Make sure you discuss any questions you have with your health care provider. Document Released: 05/25/2003 Document Revised: 02/18/2016 Document Reviewed: 02/18/2016 Elsevier Patient Education  2020 [employer].  Diabetes Mellitus and Nutrition, Adult When you have diabetes (diabetes mellitus), it is very important to have healthy eating habits because your blood sugar (glucose) levels are greatly affected by what you eat and drink. Eating healthy  foods in the appropriate amounts, at about the same times every day, can help you:  Control your blood glucose.  Lower your risk of heart disease.  Improve your blood pressure.  Reach or maintain a healthy weight. Every person with diabetes is different, and each person has different needs for a meal plan. Your health care provider may recommend that you work with a diet and nutrition specialist (dietitian) to make a meal plan that is best for you. Your meal plan may vary depending on factors such as:  The calories you need.  The medicines you take.  Your weight.  Your blood glucose, blood pressure, and cholesterol levels.  Your activity level.  Other health conditions you have, such as heart or kidney disease. How do carbohydrates affect me? Carbohydrates, also called carbs, affect your blood glucose level more than any other type of food. Eating carbs naturally raises the amount of glucose in your blood. Carb counting is a  method for keeping track of how many carbs you eat. Counting carbs is important to keep your blood glucose at a healthy level, especially if you use insulin or take certain oral diabetes medicines. It is important to know how many carbs you can safely have in each meal. This is different for every person. Your dietitian can help you calculate how many carbs you should have at each meal and for each snack. Foods that contain carbs include:  Bread, cereal, rice, pasta, and crackers.  Potatoes and corn.  Peas, beans, and lentils.  Milk and yogurt.  Fruit and juice.  Desserts, such as cakes, cookies, ice cream, and candy. How does alcohol affect me? Alcohol can cause a sudden decrease in blood glucose (hypoglycemia), especially if you use insulin or take certain oral diabetes medicines. Hypoglycemia can be a life-threatening condition. Symptoms of hypoglycemia (sleepiness, dizziness, and confusion) are similar to symptoms of having too much alcohol. If your health care provider says that alcohol is safe for you, follow these guidelines:  Limit alcohol intake to no more than 1 drink per day for nonpregnant women and 2 drinks per day for men. One drink equals 12 oz of beer, 5 oz of wine, or 1 oz of hard liquor.  Do not drink on an empty stomach.  Keep yourself hydrated with water, diet soda, or unsweetened iced tea.  Keep in mind that regular soda, juice, and other mixers may contain a lot of sugar and must be counted as carbs. What are tips for following this plan?  Reading food labels  Start by checking the serving size on the "Nutrition Facts" label of packaged foods and drinks. The amount of calories, carbs, fats, and other nutrients listed on the label is based on one serving of the item. Many items contain more than one serving per package.  Check the total grams (g) of carbs in one serving. You can calculate the number of servings of carbs in one serving by dividing the total carbs  by 15. For example, if a food has 30 g of total carbs, it would be equal to 2 servings of carbs.  Check the number of grams (g) of saturated and trans fats in one serving. Choose foods that have low or no amount of these fats.  Check the number of milligrams (mg) of salt (sodium) in one serving. Most people should limit total sodium intake to less than 2,300 mg per day.  Always check the nutrition information of foods labeled as "low-fat" or "nonfat". These  foods may be higher in added sugar or refined carbs and should be avoided.  Talk to your dietitian to identify your daily goals for nutrients listed on the label. Shopping  Avoid buying canned, premade, or processed foods. These foods tend to be high in fat, sodium, and added sugar.  Shop around the outside edge of the grocery store. This includes fresh fruits and vegetables, bulk grains, fresh meats, and fresh dairy. Cooking  Use low-heat cooking methods, such as baking, instead of high-heat cooking methods like deep frying.  Cook using healthy oils, such as olive, canola, or sunflower oil.  Avoid cooking with butter, cream, or high-fat meats. Meal planning  Eat meals and snacks regularly, preferably at the same times every day. Avoid going long periods of time without eating.  Eat foods high in fiber, such as fresh fruits, vegetables, beans, and whole grains. Talk to your dietitian about how many servings of carbs you can eat at each meal.  Eat 4-6 ounces (oz) of lean protein each day, such as lean meat, chicken, fish, eggs, or tofu. One oz of lean protein is equal to: ? 1 oz of meat, chicken, or fish. ? 1 egg. ?  cup of tofu.  Eat some foods each day that contain healthy fats, such as avocado, nuts, seeds, and fish. Lifestyle  Check your blood glucose regularly.  Exercise regularly as told by your health care provider. This may include: ? 150 minutes of moderate-intensity or vigorous-intensity exercise each week. This  could be brisk walking, biking, or water aerobics. ? Stretching and doing strength exercises, such as yoga or weightlifting, at least 2 times a week.  Take medicines as told by your health care provider.  Do not use any products that contain nicotine or tobacco, such as cigarettes and e-cigarettes. If you need help quitting, ask your health care provider.  Work with a Veterinary surgeon or diabetes educator to identify strategies to manage stress and any emotional and social challenges. Questions to ask a health care provider  Do I need to meet with a diabetes educator?  Do I need to meet with a dietitian?  What number can I call if I have questions?  When are the best times to check my blood glucose? Where to find more information:  American Diabetes Association: diabetes.org  Academy of Nutrition and Dietetics: www.eatright.[employer] of Diabetes and Digestive and Kidney Diseases (NIH): CarFlippers.tn Summary  A healthy meal plan will help you control your blood glucose and maintain a healthy lifestyle.  Working with a diet and nutrition specialist (dietitian) can help you make a meal plan that is best for you.  Keep in mind that carbohydrates (carbs) and alcohol have immediate effects on your blood glucose levels. It is important to count carbs and to use alcohol carefully. This information is not intended to replace advice given to you by your health care provider. Make sure you discuss any questions you have with your health care provider. Document Released: 02/16/2005 Document Revised: 05/04/2017 Document Reviewed: 06/26/2016 Elsevier Patient Education  2020 Elsevier Inc.  Diabetes Basics  Diabetes (diabetes mellitus) is a long-term (chronic) disease. It occurs when the body does not properly use sugar (glucose) that is released from food after you eat. Diabetes may be caused by one or both of these problems:  Your pancreas does not make enough of a hormone  called insulin.  Your body does not react in a normal way to insulin that it makes. Insulin lets sugars (  glucose) go into cells in your body. This gives you energy. If you have diabetes, sugars cannot get into cells. This causes high blood sugar (hyperglycemia). Follow these instructions at home: How is diabetes treated? You may need to take insulin or other diabetes medicines daily to keep your blood sugar in balance. Take your diabetes medicines every day as told by your doctor. List your diabetes medicines here: Diabetes medicines  Name of medicine: ______________________________ ? Amount (dose): _______________ Time (a.m./p.m.): _______________ Notes: ___________________________________  Name of medicine: ______________________________ ? Amount (dose): _______________ Time (a.m./p.m.): _______________ Notes: ___________________________________  Name of medicine: ______________________________ ? Amount (dose): _______________ Time (a.m./p.m.): _______________ Notes: ___________________________________ If you use insulin, you will learn how to give yourself insulin by injection. You may need to adjust the amount based on the food that you eat. List the types of insulin you use here: Insulin  Insulin type: ______________________________ ? Amount (dose): _______________ Time (a.m./p.m.): _______________ Notes: ___________________________________  Insulin type: ______________________________ ? Amount (dose): _______________ Time (a.m./p.m.): _______________ Notes: ___________________________________  Insulin type: ______________________________ ? Amount (dose): _______________ Time (a.m./p.m.): _______________ Notes: ___________________________________  Insulin type: ______________________________ ? Amount (dose): _______________ Time (a.m./p.m.): _______________ Notes: ___________________________________  Insulin type: ______________________________ ? Amount (dose): _______________  Time (a.m./p.m.): _______________ Notes: ___________________________________ How do I manage my blood sugar?  Check your blood sugar levels using a blood glucose monitor as directed by your doctor. Your doctor will set treatment goals for you. Generally, you should have these blood sugar levels:  Before meals (preprandial): 80-130 mg/dL (1.6-1.0 mmol/L).  After meals (postprandial): below 180 mg/dL (10 mmol/L).  A1c level: less than 7%. Write down the times that you will check your blood sugar levels: Blood sugar checks  Time: _______________ Notes: ___________________________________  Time: _______________ Notes: ___________________________________  Time: _______________ Notes: ___________________________________  Time: _______________ Notes: ___________________________________  Time: _______________ Notes: ___________________________________  Time: _______________ Notes: ___________________________________  What do I need to know about low blood sugar? Low blood sugar is called hypoglycemia. This is when blood sugar is at or below 70 mg/dL (3.9 mmol/L). Symptoms may include:  Feeling: ? Hungry. ? Worried or nervous (anxious). ? Sweaty and clammy. ? Confused. ? Dizzy. ? Sleepy. ? Sick to your stomach (nauseous).  Having: ? A fast heartbeat. ? A headache. ? A change in your vision. ? Tingling or no feeling (numbness) around the mouth, lips, or tongue. ? Jerky movements that you cannot control (seizure).  Having trouble with: ? Moving (coordination). ? Sleeping. ? Passing out (fainting). ? Getting upset easily (irritability). Treating low blood sugar To treat low blood sugar, eat or drink something sugary right away. If you can think clearly and swallow safely, follow the 15:15 rule:  Take 15 grams of a fast-acting carb (carbohydrate). Talk with your doctor about how much you should take.  Some fast-acting carbs are: ? Sugar tablets (glucose pills). Take 3-4  glucose pills. ? 6-8 pieces of hard candy. ? 4-6 oz (120-150 mL) of fruit juice. ? 4-6 oz (120-150 mL) of regular (not diet) soda. ? 1 Tbsp (15 mL) honey or sugar.  Check your blood sugar 15 minutes after you take the carb.  If your blood sugar is still at or below 70 mg/dL (3.9 mmol/L), take 15 grams of a carb again.  If your blood sugar does not go above 70 mg/dL (3.9 mmol/L) after 3 tries, get help right away.  After your blood sugar goes back to normal, eat a meal or a snack within 1 hour. Treating  very low blood sugar If your blood sugar is at or below 54 mg/dL (3 mmol/L), you have very low blood sugar (severe hypoglycemia). This is an emergency. Do not wait to see if the symptoms will go away. Get medical help right away. Call your local emergency services (911 in the [place] Do not drive yourself to the hospital. Questions to ask your health care provider  Do I need to meet with a diabetes educator?  What equipment will I need to care for myself at home?  What diabetes medicines do I need? When should I take them?  How often do I need to check my blood sugar?  What number can I call if I have questions?  When is my next doctor's visit?  Where can I find a support group for people with diabetes? Where to find more information  American Diabetes Association: www.diabetes.org  American Association of Diabetes Educators: www.diabeteseducator.org/patient-resources Contact a doctor if:  Your blood sugar is at or above 240 mg/dL (95.6 mmol/L) for 2 days in a row.  You have been sick or have had a fever for 2 days or more, and you are not getting better.  You have any of these problems for more than 6 hours: ? You cannot eat or drink. ? You feel sick to your stomach (nauseous). ? You throw up (vomit). ? You have watery poop (diarrhea). Get help right away if:  Your blood sugar is lower than 54 mg/dL (3 mmol/L).  You get confused.  You have trouble: ? Thinking  clearly. ? Breathing. Summary  Diabetes (diabetes mellitus) is a long-term (chronic) disease. It occurs when the body does not properly use sugar (glucose) that is released from food after digestion.  Take insulin and diabetes medicines as told.  Check your blood sugar every day, as often as told.  Keep all follow-up visits as told by your doctor. This is important. This information is not intended to replace advice given to you by your health care provider. Make sure you discuss any questions you have with your health care provider. Document Released: 08/24/2017 Document Revised: 07/12/2018 Document Reviewed: 08/24/2017 Elsevier Patient Education  2020 [employer].  Diabetic Ketoacidosis Diabetic ketoacidosis is a serious complication of diabetes. This condition develops when there is not enough insulin in the body. Insulin is an hormone that regulates blood sugar levels in the body. Normally, insulin allows glucose to enter the cells in the body. The cells break down glucose for energy. Without enough insulin, the body cannot break down glucose, so it breaks down fats instead. This leads to high blood glucose levels in the body and the production of acids that are called ketones. Ketones are poisonous at high levels. If diabetic ketoacidosis is not treated, it can cause severe dehydration and can lead to a coma or death. What are the causes? This condition develops when a lack of insulin causes the body to break down fats instead of glucose. This may be triggered by:  Stress on the body. This stress is brought on by an illness.  Infection.  Medicines that raise blood glucose levels.  Not taking diabetes medicine.  New onset of type 1 diabetes mellitus. What are the signs or symptoms? Symptoms of this condition include:  Fatigue.  Weight loss.  Excessive thirst.  Light-headedness.  Fruity or sweet-smelling breath.  Excessive urination.  Vision changes.  Confusion or  irritability.  Nausea.  Vomiting.  Rapid breathing.  Abdominal pain.  Feeling flushed. How is this  diagnosed? This condition is diagnosed based on your medical history, a physical exam, and blood tests. You may also have a urine test to check for ketones. How is this treated? This condition may be treated with:  Fluid replacement. This may be done to correct dehydration.  Insulin injections. These may be given through the skin or through an IV tube.  Electrolyte replacement. Electrolytes are minerals in your blood. Electrolytes such as potassium and sodium may be given in pill form or through an IV tube.  Antibiotic medicines. These may be prescribed if your condition was caused by an infection. Diabetic ketoacidosis is a serious medical condition. You may need emergency treatment in the hospital to monitor your condition. Follow these instructions at home: Eating and drinking  Drink enough fluids to keep your urine clear or pale yellow.  If you are not able to eat, drink clear fluids in small amounts as you are able. Clear fluids include water, ice chips, fruit juice with water added (diluted), and low-calorie sports drinks. You may also have sugar-free jello or popsicles.  If you are able to eat, follow your usual diet and drink sugar-free liquids, such as water. Medicines  Take over-the-counter and prescription medicines only as told by your health care provider.  Continue to take insulin and other diabetes medicines as told by your health care provider.  If you were prescribed an antibiotic, take it as told by your health care provider. Do not stop taking the antibiotic even if you start to feel better. General instructions   Check your urine for ketones when you are ill and as told by your health care provider. ? If your blood glucose is 240 mg/dL (45.4 mmol/L) or higher, check your urine ketones every 4-6 hours.  Check your blood glucose every day, as often as told  by your health care provider. ? If your blood glucose is high, drink plenty of fluids. This helps to flush out ketones. ? If your blood glucose is above your target for 2 tests in a row, contact your health care provider.  Carry a medical alert card or wear medical alert jewelry that says that you have diabetes.  Rest and exercise only as told by your health care provider. Do not exercise when your blood glucose is high and you have ketones in your urine.  If you get sick, call your health care provider and begin treatment quickly. Your body often needs extra insulin to fight an illness. Check your blood glucose every 4-6 hours when you are sick.  Keep all follow-up visits as told by your health care provider. This is important. Contact a health care provider if:  Your blood glucose level is higher than 240 mg/dL (09.8 mmol/L) for 2 days in a row.  You have moderate or large ketones in your urine.  You have a fever.  You cannot eat or drink without vomiting.  You have been vomiting for more than 2 hours.  You continue to have symptoms of diabetic ketoacidosis.  You develop new symptoms. Get help right away if:  Your blood glucose monitor reads "high" even when you are taking insulin.  You faint.  You have chest pain.  You have trouble breathing.  You have sudden trouble speaking or swallowing.  You have vomiting or diarrhea that gets worse after 3 hours.  You are unable to stay awake.  You have trouble thinking.  You are severely dehydrated. Symptoms of severe dehydration include: ? Extreme thirst. ? Dry  mouth. ? Rapid breathing. These symptoms may represent a serious problem that is an emergency. Do not wait to see if the symptoms will go away. Get medical help right away. Call your local emergency services (911 in the [place] Do not drive yourself to the hospital. Summary  Diabetic ketoacidosis is a serious complication of diabetes. This condition develops when  there is not enough insulin in the body.  This condition is diagnosed based on your medical history, a physical exam, and blood tests. You may also have a urine test to check for ketones.  Diabetic ketoacidosis is a serious medical condition. You may need emergency treatment in the hospital to monitor your condition.  Contact your health care provider if your blood glucose is higher than 240 mg/dl for 2 days in a row or if you have moderate or large ketones in your urine. This information is not intended to replace advice given to you by your health care provider. Make sure you discuss any questions you have with your health care provider. Document Released: 05/19/2000 Document Revised: 07/07/2016 Document Reviewed: 06/26/2016 Elsevier Patient Education  2020 [employer]

## 2019-06-03 NOTE — Progress Notes (Signed)
Inpatient Diabetes Program Recommendations  AACE/ADA: New Consensus Statement on Inpatient Glycemic Control (2015)  Target Ranges:  Prepandial:   less than 140 mg/dL      Peak postprandial:   less than 180 mg/dL (1-2 hours)      Critically ill patients:  140 - 180 mg/dL   Lab Results  Component Value Date   GLUCAP 207 (H) 06/02/2019   HGBA1C 13.1 (H) 05/30/2019    Review of Glycemic Control  HgbA1C - 13.1%  Spoke with both patient and his wife regarding HgbA1C of 13.1% and his glycemic control at home. Pt states his blood sugars are usually in 300s, only takes 75/25 once/day. Drinks regular sodas and sweet tea, along with high CHO foods. Pt states he needs to get serious about controlling his blood sugars. Has Elenor Legato and will f/u with PCP (Dr Virgina Jock) within the next week or two. Encouraged pt to take blood sugar log with him. Needs to reduce HgbA1C to 7% to decrease risk for long-term complications from high blood sugars.   To be discharged on Levemir 22 units QD. Will likely need meal coverage insulin since 75/25 will be discontinued.   Discussed with RN.   Thank you. Lorenda Peck, RD, LDN, CDE Inpatient Diabetes Coordinator [phone]

## 2019-06-03 NOTE — TOC Benefit Eligibility Note (Signed)
Transition of Care [facility]) Benefit Eligibility Note    Patient Details  Name: Benjamin [name] MRN: [id number] Date of Birth: 30-Dec-1975   Medication/Dose: Levimir no dose listed asked Rep to quote highest dose for flex pin and vial  Covered?: Yes  Tier: 3 Drug  Prescription Coverage Preferred Pharmacy: local  Spoke with Person/Company/Phone Number:: Billie/ [employer] [phone]  Co-Pay: 0 copay  Prior Approval: No  Deductible: Met       Kerin Salen Phone Number: 06/03/2019, 9:22 AM

## 2019-06-03 NOTE — Discharge Summary (Signed)
Physician Discharge Summary  CHRISTOHER DRUDGE BSW:[id number] DOB: [birth year] DOA: 05/29/2019  PCP: Creola Corn, MD  Admit date: 05/29/2019 Discharge date: 06/03/2019  Admitted From:  Disposition:    Recommendations for Outpatient Follow-up:  1. Follow up with PCP in 1-2 weeks 2. Please obtain BMP in one week 3. Discontinued NPH insulin in favor of Levemir 22 units subcutaneously daily 4. Continue to monitor glucose and adjust insulin as required   Home Health: No Equipment/Devices: None  Discharge Condition: Stable CODE STATUS: Full code Diet recommendation: Heart healthy/consistent carbohydrate  History of present illness:  Benjamin [name] is a 43 year old with PMHx diabetes mellitus, ADHD, depression, HTN, HLD with recent diagnosis of Covid on 05/19/2019 who presented to the ED on 05/29/2019 with nausea, vomiting, intermittent fevers.  Symptoms present for 3-4 days prior to ED arrival; and was unclear if he was taking his insulin during the time of these symptoms.  In the ED, HR 120s. RR high 20s, EBC 7.03, CO2 < 7, glucose 719, PCT < 0.1, lactate 3.8, CRP 2.2, ferritin 4957, LDH 195, ddimer 2.65. Beta-hydroxybuteric acid > 8, VBG 7.03, pco2 19.5, po2 46.6 bicarb 5.  Patient was given a 1.5 L fluid bolus and started on IV insulin.  Patient was initially admitted to the pulmonary/critical care service and transferred to [facility] on 06/01/2019.  Hospital course:  Diabetic ketoacidosis Severe anion gap metabolic acidosis Patient presented to the ED with several day history of nausea/vomiting, unlikely taking his home insulin.  Hemoglobin A1c 13.1, which is consistent with poorly controlled diabetes.  Patient on NPH 75/25 35-40u TID; but unlikely consistent with his insulin regimen as well as dietary indiscretions.  Diabetic educator was consulted to follow during hospital course.  IV insulin was transitioned to Levemir and titrated up to 22 units subcutaneously daily.  Patient was able to  transition his diet with good toleration without any further nausea/vomiting.  Discussed with patient on several occasions need to adhere to a more consistent carbohydrate diet.  Will need follow-up with PCP closely for further surveillance and titration needs.  Hypokalemia Repleted during hospitalization and resolved.  Acute renal failure: Resolved Creatinine 2.31 on admission.  Etiology likely secondary to DKA as above from underlying severe dehydration.  Now resolved with IV fluid hydration and better control of glucose.  At 0.63 at time of discharge.  COVID-19 infection; present on admission Patient with positive Covid-19 on 05/19/2019.  Currently oxygenating well on room air.  Completed 5-day course of remdesivir on 06/02/2019.  Weakness/debility: Evaluated by physical therapy, no needs identified.  Discharge Diagnoses:  Principal Problem:   DKA (diabetic ketoacidoses) (HCC) Active Problems:   COVID-19 virus infection    Discharge Instructions  Discharge Instructions    Call MD for:  difficulty breathing, headache or visual disturbances   Complete by: As directed    Call MD for:  extreme fatigue   Complete by: As directed    Call MD for:  persistant dizziness or light-headedness   Complete by: As directed    Call MD for:  persistant nausea and vomiting   Complete by: As directed    Call MD for:  severe uncontrolled pain   Complete by: As directed    Call MD for:  temperature >100.4   Complete by: As directed    Diet - low sodium heart healthy   Complete by: As directed    Increase activity slowly   Complete by: As directed      Allergies as of 06/03/2019  Reactions   Lisinopril    Other reaction(s): COUGH Other reaction(s): COUGH      Medication List    STOP taking these medications   insulin lispro protamine-lispro (75-25) 100 UNIT/ML Susp injection Commonly known as: HUMALOG 75/25 MIX     TAKE these medications   atorvastatin 80 MG  tablet Commonly known as: LIPITOR Take 80 mg by mouth daily at 6 PM.   FreeStyle Libre 14 Day Sensor Misc CHANGE EVERY 14 DAYS   Insulin Detemir 100 UNIT/ML Pen Commonly known as: LEVEMIR Inject 22 Units into the skin daily.   lisinopril 20 MG tablet Commonly known as: ZESTRIL Take 20 mg by mouth daily.   methylphenidate 27 MG CR tablet Commonly known as: CONCERTA Take 1 tablet (27 mg total) by mouth daily after breakfast. What changed: Another medication with the same name was removed. Continue taking this medication, and follow the directions you see here.   Pen Needles 3/16" 31G X 5 MM Misc Use as directed with insulin pen      Follow-up Information    Shon Baton, MD. Schedule an appointment as soon as possible for a visit in 1 week(s).   Specialty: Internal Medicine Contact information: [place] [address] [phone]          Allergies  Allergen Reactions  . Lisinopril     Other reaction(s): COUGH Other reaction(s): COUGH     Consultations:  PCCM   Procedures/Studies: DG Chest Port 1 View  Result Date: 05/29/2019 CLINICAL DATA:  Weakness. COVID-19 positive with worsening symptoms. EXAM: PORTABLE CHEST 1 VIEW COMPARISON:  None. FINDINGS: The heart size and mediastinal contours are within normal limits. Both lungs are clear. The visualized skeletal structures are unremarkable. IMPRESSION: Normal examination. Electronically Signed   By: Claudie Revering M.D.   On: 05/29/2019 12:41      Subjective:   Discharge Exam: Vitals:   06/03/19 0800 06/03/19 0825  BP: (!) 163/90   Pulse: 86   Resp: 12   Temp:  98 F (36.7 C)  SpO2: 98%    Vitals:   06/03/19 0600 06/03/19 0700 06/03/19 0800 06/03/19 0825  BP: (!) 149/92 (!) 146/80 (!) 163/90   Pulse: 74 72 86   Resp: 18 10 12    Temp:    98 F (36.7 C)  TempSrc:    Oral  SpO2: 93% 95% 98%   Weight:      Height:        General: Pt is alert, awake, not in acute  distress Cardiovascular: RRR, S1/S2 +, no rubs, no gallops Respiratory: CTA bilaterally, no wheezing, no rhonchi Abdominal: Soft, NT, ND, bowel sounds + Extremities: no edema, no cyanosis    The results of significant diagnostics from this hospitalization (including imaging, microbiology, ancillary and laboratory) are listed below for reference.     Microbiology: Recent Results (from the past 240 hour(s))  Blood Culture (routine x 2)     Status: None (Preliminary result)   Collection Time: 05/29/19 11:36 AM   Specimen: BLOOD  Result Value Ref Range Status   Specimen Description   Final    BLOOD LEFT ANTECUBITAL Performed at [facility] [address], [place], [place] [address]    Special Requests   Final    BOTTLES DRAWN AEROBIC AND ANAEROBIC Blood Culture adequate volume Performed at [facility] [address] [place], [place] [address]    Culture   Final    NO GROWTH 4  DAYS Performed at [facility], [address] [place], [place] [address]    Report Status PENDING  Incomplete  Blood Culture (routine x 2)     Status: None (Preliminary result)   Collection Time: 05/29/19 11:41 AM   Specimen: BLOOD  Result Value Ref Range Status   Specimen Description   Final    BLOOD SITE NOT SPECIFIED Performed at [facility], [address], [place], [place] [address]    Special Requests   Final    BOTTLES DRAWN AEROBIC AND ANAEROBIC Blood Culture adequate volume Performed at [facility] [address], [place], [place] [address]    Culture   Final    NO GROWTH 4 DAYS Performed at [facility], [address], [place], [place] [address]    Report Status PENDING  Incomplete  MRSA PCR Screening     Status: None   Collection Time: 05/29/19  9:23 PM   Specimen: Nasal Mucosa; Nasopharyngeal  Result Value Ref Range Status   MRSA by PCR NEGATIVE NEGATIVE Final    Comment:        The  GeneXpert MRSA Assay (FDA approved for NASAL specimens only), is one component of a comprehensive MRSA colonization surveillance program. It is not intended to diagnose MRSA infection nor to guide or monitor treatment for MRSA infections. Performed at [facility], [address] [place], [place] [address]      Labs: BNP (last 3 results) No results for input(s): BNP in the last 8760 hours. Basic Metabolic Panel: Recent Labs  Lab 05/30/19 0258 05/30/19 1551 05/31/19 0255 05/31/19 1308 05/31/19 1544 05/31/19 2140 06/01/19 0224 06/02/19 0211 06/03/19 0139  NA 140 141 140 141 140 134*  --  138  --   K 4.0 3.6 3.6 3.5 3.2* 3.1*  --  3.5  --   CL 111 114* 113* 113* 112* 106  --  106  --   CO2 14* 18* 19* 20* 21* 18*  --  23  --   GLUCOSE 180* 180* 161* 190* 208* 183*  --  112*  --   BUN 38* 24* --  10  --   CREATININE 1.32* 0.96 0.95 0.90 0.81 0.79  --  0.63  --   CALCIUM 8.6* 8.4* 8.3* 8.2* 8.3* 7.9*  --  8.1*  --   MG 2.4 2.5* 2.5*  --   --   --  2.1 1.9 1.8  PHOS 2.6  --  1.7*  --   --   --  2.4* 2.5 3.3   Liver Function Tests: Recent Labs  Lab 05/30/19 0258 05/31/19 0255 06/01/19 0224 06/02/19 0211 06/03/19 0139  AST 12* 12* 10* 11* 11*  ALT ALKPHOS 64 53 54 57 58  BILITOT 0.7 0.7 0.6 0.8 0.4  PROT 6.1* 6.0* 5.2* 5.1* 5.2*  ALBUMIN 2.8* 2.7* 2.3* 2.1* 2.1*   No results for input(s): LIPASE, AMYLASE in the last 168 hours. No results for input(s): AMMONIA in the last 168 hours. CBC: Recent Labs  Lab 05/29/19 1133 05/29/19 1653  WBC 17.1* 18.3*  NEUTROABS 13.7*  --   HGB 19.2* 16.8  HCT 58.2* 49.6  MCV 88.3 87.0  PLT 451* 308   Cardiac Enzymes: No results for input(s): CKTOTAL, CKMB, CKMBINDEX, TROPONINI in the last 168 hours. BNP: Invalid input(s): POCBNP CBG: Recent Labs  Lab 06/01/19 2159 06/02/19 0816 06/02/19 1353  06/02/19 1618 06/02/19 2100  GLUCAP 95 128* 182* 162* 207*    D-Dimer Recent Labs    06/02/19 0211 06/03/19 0139  DDIMER 2.23* 2.02*   Hgb A1c No results for input(s): HGBA1C in the last 72 hours. Lipid Profile No results for input(s): CHOL, HDL, LDLCALC, TRIG, CHOLHDL, LDLDIRECT in the last 72 hours. Thyroid function studies No results for input(s): TSH, T4TOTAL, T3FREE, THYROIDAB in the last 72 hours.  Invalid input(s): FREET3 Anemia work up Recent Labs    06/02/19 0211 06/03/19 0139  FERRITIN 1,131* 948*   Urinalysis    Component Value Date/Time   COLORURINE STRAW (A) 05/29/2019 1542   APPEARANCEUR CLEAR 05/29/2019 1542   LABSPEC 1.018 05/29/2019 1542   PHURINE 5.0 05/29/2019 1542   GLUCOSEU >=500 (A) 05/29/2019 1542   HGBUR SMALL (A) 05/29/2019 1542   BILIRUBINUR NEGATIVE 05/29/2019 1542   KETONESUR 80 (A) 05/29/2019 1542   PROTEINUR 100 (A) 05/29/2019 1542   NITRITE NEGATIVE 05/29/2019 1542   LEUKOCYTESUR NEGATIVE 05/29/2019 1542   Sepsis Labs Invalid input(s): PROCALCITONIN,  WBC,  LACTICIDVEN Microbiology Recent Results (from the past 240 hour(s))  Blood Culture (routine x 2)     Status: None (Preliminary result)   Collection Time: 05/29/19 11:36 AM   Specimen: BLOOD  Result Value Ref Range Status   Specimen Description   Final    BLOOD LEFT ANTECUBITAL Performed at [facility], [address] [place], [place] [address]    Special Requests   Final    BOTTLES DRAWN AEROBIC AND ANAEROBIC Blood Culture adequate volume Performed at [facility], [address] [place], [place] [address]    Culture   Final    NO GROWTH 4 DAYS Performed at [facility], [address] [place], [place] [address]    Report Status PENDING  Incomplete  Blood Culture (routine x 2)     Status: None (Preliminary result)   Collection Time: 05/29/19 11:41 AM   Specimen: BLOOD  Result Value Ref Range Status   Specimen Description   Final    BLOOD SITE NOT SPECIFIED Performed at [facility], [address] [place], [place] [address]    Special Requests   Final    BOTTLES DRAWN AEROBIC AND ANAEROBIC Blood Culture adequate volume Performed at [facility], [address] [place], [place] [address]    Culture   Final    NO GROWTH 4 DAYS Performed at [facility], [address] [place], [place] [address]    Report Status PENDING  Incomplete  MRSA PCR Screening     Status: None   Collection Time: 05/29/19  9:23 PM   Specimen: Nasal Mucosa; Nasopharyngeal  Result Value Ref Range Status   MRSA by PCR NEGATIVE NEGATIVE Final    Comment:        The GeneXpert MRSA Assay (FDA approved for NASAL specimens only), is one component of a comprehensive MRSA colonization surveillance program. It is not intended to diagnose MRSA infection nor to guide or monitor treatment for MRSA infections. Performed at [facility], [address] [place], [place] [address]      Time coordinating discharge: Over 30 minutes  SIGNED:   Alvira PhilipsEric J [place], DO  [facility] 06/03/2019, 9:39 AM

## 2019-06-09 ENCOUNTER — Ambulatory Visit: Payer: BC Managed Care – PPO | Admitting: Psychiatry

## 2019-06-09 DIAGNOSIS — U071 COVID-19: Secondary | ICD-10-CM | POA: Diagnosis not present

## 2019-06-09 DIAGNOSIS — E11319 Type 2 diabetes mellitus with unspecified diabetic retinopathy without macular edema: Secondary | ICD-10-CM | POA: Diagnosis not present

## 2019-06-09 DIAGNOSIS — I10 Essential (primary) hypertension: Secondary | ICD-10-CM | POA: Diagnosis not present

## 2019-06-09 DIAGNOSIS — E1169 Type 2 diabetes mellitus with other specified complication: Secondary | ICD-10-CM | POA: Diagnosis not present

## 2019-06-09 DIAGNOSIS — E1139 Type 2 diabetes mellitus with other diabetic ophthalmic complication: Secondary | ICD-10-CM | POA: Diagnosis not present

## 2019-06-18 DIAGNOSIS — Z794 Long term (current) use of insulin: Secondary | ICD-10-CM | POA: Diagnosis not present

## 2019-06-18 DIAGNOSIS — I10 Essential (primary) hypertension: Secondary | ICD-10-CM | POA: Diagnosis not present

## 2019-06-18 DIAGNOSIS — N182 Chronic kidney disease, stage 2 (mild): Secondary | ICD-10-CM | POA: Diagnosis not present

## 2019-06-18 DIAGNOSIS — E1022 Type 1 diabetes mellitus with diabetic chronic kidney disease: Secondary | ICD-10-CM | POA: Diagnosis not present

## 2019-07-31 DIAGNOSIS — N182 Chronic kidney disease, stage 2 (mild): Secondary | ICD-10-CM | POA: Diagnosis not present

## 2019-07-31 DIAGNOSIS — E1022 Type 1 diabetes mellitus with diabetic chronic kidney disease: Secondary | ICD-10-CM | POA: Diagnosis not present

## 2019-07-31 DIAGNOSIS — Z794 Long term (current) use of insulin: Secondary | ICD-10-CM | POA: Diagnosis not present

## 2019-08-11 DIAGNOSIS — E113393 Type 2 diabetes mellitus with moderate nonproliferative diabetic retinopathy without macular edema, bilateral: Secondary | ICD-10-CM | POA: Diagnosis not present

## 2019-08-19 DIAGNOSIS — Z3009 Encounter for other general counseling and advice on contraception: Secondary | ICD-10-CM | POA: Diagnosis not present

## 2019-09-11 DIAGNOSIS — E1022 Type 1 diabetes mellitus with diabetic chronic kidney disease: Secondary | ICD-10-CM | POA: Diagnosis not present

## 2019-09-11 DIAGNOSIS — N182 Chronic kidney disease, stage 2 (mild): Secondary | ICD-10-CM | POA: Diagnosis not present

## 2019-09-11 DIAGNOSIS — I10 Essential (primary) hypertension: Secondary | ICD-10-CM | POA: Diagnosis not present

## 2019-09-11 DIAGNOSIS — Z794 Long term (current) use of insulin: Secondary | ICD-10-CM | POA: Diagnosis not present

## 2019-09-25 DIAGNOSIS — Z302 Encounter for sterilization: Secondary | ICD-10-CM | POA: Diagnosis not present

## 2019-09-30 DIAGNOSIS — E119 Type 2 diabetes mellitus without complications: Secondary | ICD-10-CM | POA: Diagnosis not present

## 2019-09-30 DIAGNOSIS — E1165 Type 2 diabetes mellitus with hyperglycemia: Secondary | ICD-10-CM | POA: Diagnosis not present

## 2019-09-30 DIAGNOSIS — Z794 Long term (current) use of insulin: Secondary | ICD-10-CM | POA: Diagnosis not present

## 2019-10-13 DIAGNOSIS — E1022 Type 1 diabetes mellitus with diabetic chronic kidney disease: Secondary | ICD-10-CM | POA: Diagnosis not present

## 2019-10-13 DIAGNOSIS — N182 Chronic kidney disease, stage 2 (mild): Secondary | ICD-10-CM | POA: Diagnosis not present

## 2019-10-13 DIAGNOSIS — Z794 Long term (current) use of insulin: Secondary | ICD-10-CM | POA: Diagnosis not present

## 2019-10-13 DIAGNOSIS — I10 Essential (primary) hypertension: Secondary | ICD-10-CM | POA: Diagnosis not present

## 2019-10-17 DIAGNOSIS — E1022 Type 1 diabetes mellitus with diabetic chronic kidney disease: Secondary | ICD-10-CM | POA: Diagnosis not present

## 2019-10-17 DIAGNOSIS — Z4681 Encounter for fitting and adjustment of insulin pump: Secondary | ICD-10-CM | POA: Diagnosis not present

## 2019-10-17 DIAGNOSIS — Z794 Long term (current) use of insulin: Secondary | ICD-10-CM | POA: Diagnosis not present

## 2019-10-23 DIAGNOSIS — E10319 Type 1 diabetes mellitus with unspecified diabetic retinopathy without macular edema: Secondary | ICD-10-CM | POA: Diagnosis not present

## 2019-10-23 DIAGNOSIS — Z794 Long term (current) use of insulin: Secondary | ICD-10-CM | POA: Diagnosis not present

## 2019-10-23 DIAGNOSIS — E1022 Type 1 diabetes mellitus with diabetic chronic kidney disease: Secondary | ICD-10-CM | POA: Diagnosis not present

## 2019-10-23 DIAGNOSIS — N182 Chronic kidney disease, stage 2 (mild): Secondary | ICD-10-CM | POA: Diagnosis not present

## 2019-12-23 DIAGNOSIS — N179 Acute kidney failure, unspecified: Secondary | ICD-10-CM | POA: Diagnosis not present

## 2019-12-23 DIAGNOSIS — Z794 Long term (current) use of insulin: Secondary | ICD-10-CM | POA: Diagnosis not present

## 2019-12-23 DIAGNOSIS — E1022 Type 1 diabetes mellitus with diabetic chronic kidney disease: Secondary | ICD-10-CM | POA: Diagnosis not present

## 2019-12-23 DIAGNOSIS — I129 Hypertensive chronic kidney disease with stage 1 through stage 4 chronic kidney disease, or unspecified chronic kidney disease: Secondary | ICD-10-CM | POA: Diagnosis not present

## 2019-12-23 DIAGNOSIS — E1039 Type 1 diabetes mellitus with other diabetic ophthalmic complication: Secondary | ICD-10-CM | POA: Diagnosis not present

## 2019-12-24 ENCOUNTER — Other Ambulatory Visit: Payer: Self-pay | Admitting: Internal Medicine

## 2019-12-24 DIAGNOSIS — R000 Tachycardia, unspecified: Secondary | ICD-10-CM

## 2019-12-24 DIAGNOSIS — E785 Hyperlipidemia, unspecified: Secondary | ICD-10-CM

## 2019-12-29 DIAGNOSIS — E1165 Type 2 diabetes mellitus with hyperglycemia: Secondary | ICD-10-CM | POA: Diagnosis not present

## 2019-12-29 DIAGNOSIS — Z794 Long term (current) use of insulin: Secondary | ICD-10-CM | POA: Diagnosis not present

## 2019-12-29 DIAGNOSIS — E119 Type 2 diabetes mellitus without complications: Secondary | ICD-10-CM | POA: Diagnosis not present

## 2020-01-19 DIAGNOSIS — E1165 Type 2 diabetes mellitus with hyperglycemia: Secondary | ICD-10-CM | POA: Diagnosis not present

## 2020-01-19 DIAGNOSIS — Z794 Long term (current) use of insulin: Secondary | ICD-10-CM | POA: Diagnosis not present

## 2020-01-19 DIAGNOSIS — E119 Type 2 diabetes mellitus without complications: Secondary | ICD-10-CM | POA: Diagnosis not present

## 2020-02-03 DIAGNOSIS — E1022 Type 1 diabetes mellitus with diabetic chronic kidney disease: Secondary | ICD-10-CM | POA: Diagnosis not present

## 2020-02-03 DIAGNOSIS — Z23 Encounter for immunization: Secondary | ICD-10-CM | POA: Diagnosis not present

## 2020-02-03 DIAGNOSIS — I10 Essential (primary) hypertension: Secondary | ICD-10-CM | POA: Diagnosis not present

## 2020-02-17 DIAGNOSIS — E113393 Type 2 diabetes mellitus with moderate nonproliferative diabetic retinopathy without macular edema, bilateral: Secondary | ICD-10-CM | POA: Diagnosis not present

## 2020-03-04 DIAGNOSIS — Z23 Encounter for immunization: Secondary | ICD-10-CM | POA: Diagnosis not present

## 2020-03-22 DIAGNOSIS — E119 Type 2 diabetes mellitus without complications: Secondary | ICD-10-CM | POA: Diagnosis not present

## 2020-03-22 DIAGNOSIS — Z794 Long term (current) use of insulin: Secondary | ICD-10-CM | POA: Diagnosis not present

## 2020-03-25 ENCOUNTER — Encounter: Payer: Self-pay | Admitting: Psychiatry

## 2020-04-12 DIAGNOSIS — E1165 Type 2 diabetes mellitus with hyperglycemia: Secondary | ICD-10-CM | POA: Diagnosis not present

## 2020-04-23 DIAGNOSIS — E785 Hyperlipidemia, unspecified: Secondary | ICD-10-CM | POA: Diagnosis not present

## 2020-04-23 DIAGNOSIS — Z0000 Encounter for general adult medical examination without abnormal findings: Secondary | ICD-10-CM | POA: Diagnosis not present

## 2020-04-23 DIAGNOSIS — E1022 Type 1 diabetes mellitus with diabetic chronic kidney disease: Secondary | ICD-10-CM | POA: Diagnosis not present

## 2020-04-23 DIAGNOSIS — Z125 Encounter for screening for malignant neoplasm of prostate: Secondary | ICD-10-CM | POA: Diagnosis not present

## 2020-04-30 DIAGNOSIS — E119 Type 2 diabetes mellitus without complications: Secondary | ICD-10-CM | POA: Diagnosis not present

## 2020-04-30 DIAGNOSIS — Z794 Long term (current) use of insulin: Secondary | ICD-10-CM | POA: Diagnosis not present

## 2020-04-30 DIAGNOSIS — E1165 Type 2 diabetes mellitus with hyperglycemia: Secondary | ICD-10-CM | POA: Diagnosis not present

## 2020-05-03 DIAGNOSIS — Z23 Encounter for immunization: Secondary | ICD-10-CM | POA: Diagnosis not present

## 2020-05-03 DIAGNOSIS — I10 Essential (primary) hypertension: Secondary | ICD-10-CM | POA: Diagnosis not present

## 2020-05-03 DIAGNOSIS — E1022 Type 1 diabetes mellitus with diabetic chronic kidney disease: Secondary | ICD-10-CM | POA: Diagnosis not present

## 2020-05-03 DIAGNOSIS — Z0000 Encounter for general adult medical examination without abnormal findings: Secondary | ICD-10-CM | POA: Diagnosis not present

## 2020-05-03 DIAGNOSIS — R82998 Other abnormal findings in urine: Secondary | ICD-10-CM | POA: Diagnosis not present

## 2020-05-05 DIAGNOSIS — M7501 Adhesive capsulitis of right shoulder: Secondary | ICD-10-CM | POA: Diagnosis not present

## 2020-05-05 DIAGNOSIS — M7502 Adhesive capsulitis of left shoulder: Secondary | ICD-10-CM | POA: Diagnosis not present

## 2020-05-10 DIAGNOSIS — M7502 Adhesive capsulitis of left shoulder: Secondary | ICD-10-CM | POA: Diagnosis not present

## 2020-05-10 DIAGNOSIS — M7501 Adhesive capsulitis of right shoulder: Secondary | ICD-10-CM | POA: Diagnosis not present

## 2020-05-14 DIAGNOSIS — M7501 Adhesive capsulitis of right shoulder: Secondary | ICD-10-CM | POA: Diagnosis not present

## 2020-05-14 DIAGNOSIS — M7502 Adhesive capsulitis of left shoulder: Secondary | ICD-10-CM | POA: Diagnosis not present

## 2020-05-18 DIAGNOSIS — M7501 Adhesive capsulitis of right shoulder: Secondary | ICD-10-CM | POA: Diagnosis not present

## 2020-05-21 DIAGNOSIS — M7502 Adhesive capsulitis of left shoulder: Secondary | ICD-10-CM | POA: Diagnosis not present

## 2020-05-24 DIAGNOSIS — M7502 Adhesive capsulitis of left shoulder: Secondary | ICD-10-CM | POA: Diagnosis not present

## 2020-06-01 DIAGNOSIS — M7502 Adhesive capsulitis of left shoulder: Secondary | ICD-10-CM | POA: Diagnosis not present

## 2020-06-01 DIAGNOSIS — M7501 Adhesive capsulitis of right shoulder: Secondary | ICD-10-CM | POA: Diagnosis not present

## 2020-06-03 DIAGNOSIS — M7502 Adhesive capsulitis of left shoulder: Secondary | ICD-10-CM | POA: Diagnosis not present

## 2020-06-03 DIAGNOSIS — M7501 Adhesive capsulitis of right shoulder: Secondary | ICD-10-CM | POA: Diagnosis not present

## 2020-06-09 DIAGNOSIS — M7502 Adhesive capsulitis of left shoulder: Secondary | ICD-10-CM | POA: Diagnosis not present

## 2020-06-09 DIAGNOSIS — E1022 Type 1 diabetes mellitus with diabetic chronic kidney disease: Secondary | ICD-10-CM | POA: Diagnosis not present

## 2020-06-11 DIAGNOSIS — M7502 Adhesive capsulitis of left shoulder: Secondary | ICD-10-CM | POA: Diagnosis not present

## 2020-06-11 DIAGNOSIS — M7501 Adhesive capsulitis of right shoulder: Secondary | ICD-10-CM | POA: Diagnosis not present

## 2020-06-24 DIAGNOSIS — M7502 Adhesive capsulitis of left shoulder: Secondary | ICD-10-CM | POA: Diagnosis not present

## 2020-06-24 DIAGNOSIS — M7501 Adhesive capsulitis of right shoulder: Secondary | ICD-10-CM | POA: Diagnosis not present

## 2020-07-05 ENCOUNTER — Other Ambulatory Visit: Payer: Self-pay

## 2020-07-05 ENCOUNTER — Encounter: Payer: Self-pay | Admitting: Neurology

## 2020-07-05 ENCOUNTER — Ambulatory Visit (INDEPENDENT_AMBULATORY_CARE_PROVIDER_SITE_OTHER): Payer: BC Managed Care – PPO | Admitting: Neurology

## 2020-07-05 VITALS — BP 130/89 | HR 106 | Ht 72.0 in | Wt 254.0 lb

## 2020-07-05 DIAGNOSIS — E66811 Obesity, class 1: Secondary | ICD-10-CM | POA: Insufficient documentation

## 2020-07-05 DIAGNOSIS — E1111 Type 2 diabetes mellitus with ketoacidosis with coma: Secondary | ICD-10-CM

## 2020-07-05 DIAGNOSIS — U071 COVID-19: Secondary | ICD-10-CM

## 2020-07-05 DIAGNOSIS — Z8669 Personal history of other diseases of the nervous system and sense organs: Secondary | ICD-10-CM

## 2020-07-05 DIAGNOSIS — E669 Obesity, unspecified: Secondary | ICD-10-CM | POA: Diagnosis not present

## 2020-07-05 DIAGNOSIS — Z794 Long term (current) use of insulin: Secondary | ICD-10-CM

## 2020-07-05 DIAGNOSIS — G4719 Other hypersomnia: Secondary | ICD-10-CM | POA: Insufficient documentation

## 2020-07-05 DIAGNOSIS — G478 Other sleep disorders: Secondary | ICD-10-CM | POA: Diagnosis not present

## 2020-07-05 NOTE — Patient Instructions (Signed)

## 2020-07-05 NOTE — Progress Notes (Signed)
SLEEP MEDICINE CLINIC    Provider:  Melvyn Novas, MD  Primary Care Physician:  Creola Corn, MD [address] [place] [address]     Referring Provider: Creola Corn, Md [address] [place],  [place] [address]          Chief Complaint according to patient   Patient presents with:    . New Patient (Initial Visit)     Presents today to evaluate OSA concerns. Had a SS 2012 here which showed mild osa. Was initially started on CPAP and used it for about 2 yrs( he had issues with the mask)     Recently has had wt gain which has caused him to start snoring. Wife is not currently witnessing apnea events.        HISTORY OF PRESENT ILLNESS:  Benjamin [name] is a 45 - year- old Caucasian male patient and is seen on 07/05/2020  Upon sleep cpnsultation requested by Dr. Timothy Lasso. Chief concern according to patient : see above  He now needs naps and he feels he is not always safe to drive, ready to doze off. He had a huge weight loss due to COVID ,  [facility]- ICU for 8 days in 12/ 2020. He has not ben on CPAP. Non restorative sleep now.  Benjamin [name], a right -handed Caucasian male with a previous diagnosis of OSA and Covid related  DKA , ICU hospitalization., SOB- breathing disorder. He has a past medical history of Vitiligo, ADD (attention deficit disorder) (02/23/2018),  OSA 2012, Obesity and Depression (02/23/2018), and Diabetes type I (HCC).   The patient had the first sleep study in the year 2012,  with a result of OSA at a mild degree, AHI of 7.8/h. Marland Kitchen     Sleep relevant medical history: Obesity , COVID 19     Family medical /sleep history: brother on CPAP with OSA.    Social history:  Patient is working as a Firefighter- and lives in a household with spouse and 2 children. per.Pets are present. Tobacco use- none   ETOH use ; rarely , Caffeine intake in form of Coffee( throughout the day 3 times 16 ounces, ) Soda( 1 a day) Tea ( /) or energy drinks. Regular exercise- not since  Covid.    Sleep habits are as follows:  The patient's dinner time is between 6-8 PM. The patient goes to bed at 10-11 PM and has not trouble to fall asleep- continues to sleep for 6-8 hours. The preferred sleep position is lateral , with the support of 2 pillows. No GERD. Dreams are reportedly frequent/vivid. 6.30  AM is the usual rise time.  The patient wakes up with an alarm.  He reports initially  feeling refreshed and  restored in AM.  Naps are taken ifrequently, lasting from 45-60 minutes and are less refreshing than nocturnal sleep.    Review of Systems: Out of a complete 14 system review, the patient complains of only the following symptoms, and all other reviewed systems are negative.:  Fatigue, sleepiness , snoring, unfragmented sleep,non restorative   COVID 19 - oxygen stayed up. Ketoacidosis, ICU. 18-20 hour of sleep for 4 days, missing medications and food, ending dehydrated and KETO.    How likely are you to doze in the following situations: 0 = not likely, 1 = slight chance, 2 = moderate chance, 3 = high chance   Sitting and Reading? Watching Television? Sitting inactive in a public place (theater  or meeting)? As a passenger in a car for an hour without a break? Lying down in the afternoon when circumstances permit? Sitting and talking to someone? Sitting quietly after lunch without alcohol? In a car, while stopped for a few minutes in traffic?   Total = 14/ 24 points   FSS endorsed at 36/ 63 points.   Social History   Socioeconomic History  . Marital status: Married    Spouse name: Not on file  . Number of children: Not on file  . Years of education: Not on file  . Highest education level: Not on file  Occupational History  . Not on file  Tobacco Use  . Smoking status: Never Smoker  . Smokeless tobacco: Never Used  Vaping Use  . Vaping Use: Never used  Substance and Sexual Activity  . Alcohol use: Not Currently  . Drug use: Not Currently    Types:  Marijuana  . Sexual activity: Yes  Other Topics Concern  . Not on file  Social History Narrative  . Not on file   Social Determinants of Health   Financial Resource Strain: Not on file  Food Insecurity: Not on file  Transportation Needs: Not on file  Physical Activity: Not on file  Stress: Not on file  Social Connections: Not on file    No family history on file.  Past Medical History:  Diagnosis Date  . ADD (attention deficit disorder) 02/23/2018  . Depression 02/23/2018  . Diabetes type I (HCC)     No past surgical history on file.   Current Outpatient Medications on File Prior to Visit  Medication Sig Dispense Refill  . atorvastatin (LIPITOR) 80 MG tablet Take 80 mg by mouth daily at 6 PM.     . Continuous Blood Gluc Sensor (FREESTYLE LIBRE 14 DAY SENSOR) MISC CHANGE EVERY 14 DAYS    . HUMALOG 100 UNIT/ML injection Inject into the skin. Insulin pump.    . Insulin Pen Needle (PEN NEEDLES 3/16") 31G X 5 MM MISC Use as directed with insulin pen 100 each 0  . losartan (COZAAR) 25 MG tablet Take 25 mg by mouth daily.     No current facility-administered medications on file prior to visit.    Allergies  Allergen Reactions  . Lisinopril     Other reaction(s): COUGH Other reaction(s): COUGH     Physical exam:  Today's Vitals   07/05/20 1523  BP: 130/89  Pulse: (!) 106  Weight: 254 lb (115.2 kg)  Height: 6' (1.829 m)   Body mass index is 34.45 kg/m.   Wt Readings from Last 3 Encounters:  07/05/20 254 lb (115.2 kg)  05/29/19 214 lb 1.1 oz (97.1 kg)     Ht Readings from Last 3 Encounters:  07/05/20 6' (1.829 m)  05/29/19 6\' 1"  (1.854 m)      General: The patient is awake, alert and appears not in acute distress. The patient is well groomed. Head: Normocephalic, atraumatic. Neck is supple. Mallampati 2,  neck circumference:16. 75  Inches.  Nasal airflow  patent.   Retrognathia is not  seen.  Dental status:  Cardiovascular:  Regular rate and cardiac  rhythm by pulse,  without distended neck veins. Respiratory: Lungs are clear to auscultation.  Skin:  Without evidence of ankle edema, or rash. Trunk: The patient's posture is erect.   Neurologic exam : The patient is awake and alert, oriented to place and time.   Memory subjective described as intact.  Attention span & concentration  ability appears normal.  Speech is fluent,  without  dysarthria, dysphonia or aphasia.  Mood and affect are appropriate.   Cranial nerves: no loss of smell or taste reported  Pupils are equal and briskly reactive to light. Funduscopic exam deferred. .  Extraocular movements in vertical and horizontal planes were intact and without nystagmus. No Diplopia. Visual fields by finger perimetry are intact. Hearing was intact to soft voice and finger rubbing.    Facial sensation intact to fine touch.  Facial motor strength is symmetric and tongue and uvula move midline.  Neck ROM : rotation, tilt and flexion extension were normal for age and shoulder shrug was symmetrical.    Motor exam:  Symmetric bulk, tone and ROM.   Normal tone without cog-wheeling, symmetric grip strength .   Sensory:  Fine touch, pinprick and vibration were tested  and  oin and needle sensation are reported in both feet.  Proprioception tested in the upper extremities was normal.   Coordination: Rapid alternating movements in the fingers/hands were of normal speed.  The Finger-to-nose maneuver was intact without evidence of ataxia, dysmetria or tremor.   Gait and station: Patient could rise unassisted from a seated position, walked without assistive device.  Stance is of normal width/ base and the patient turned with 3 steps.  Toe and heel walk were deferred.  Deep tendon reflexes: in the  upper and lower extremities are symmetric and intact.  Babinski response was deferred .     I reviewed the old sleep study- mild OSA.  After spending a total time of 40  minutes face to face and  additional time for physical and neurologic examination, review of laboratory studies,  personal review of imaging studies, reports and results of other testing and review of referral information / records as far as provided in visit, I have established the following assessments:  1)  Return to snoring and likely mild OSA as the patient's BMI rose for 26 post Covid19 in 06-2019 to now 34.5 again.  2)  Non- restorative, non -refreshing sleep 3)  cCovid related dehydration, hypersomnia, unresponsiveness  and DKA with massive weight loss.    My Plan is to proceed with:  1)  Repeat sleep evaluation can be done by HST or PSG.  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   I would like to Elvera Bicker, Md [address] [place],  [place] [address] for allowing me to meet with and to take care of this pleasant patient.   In short, Benjamin [name] is presenting with resurgence of snoring and non- restorative sleep.  I plan to follow up either personally or through our NP within 3-4 month.  May prefer a dental device over CPAP for mild apnea/     Electronically signed by: Melvyn Novas, MD 07/05/2020 3:36 PM  [facility] Sleep Board certified by [employer] of Sleep Medicine and Diplomate of the [employer] of Sleep Medicine. Board certified In Neurology through the ABPN, Fellow of the [employer] of Neu[employer]. Medical Director of [employer].

## 2020-07-06 ENCOUNTER — Telehealth: Payer: Self-pay

## 2020-07-06 NOTE — Telephone Encounter (Signed)
LVM for pt to call me back to schedule sleep study  

## 2020-07-09 DIAGNOSIS — E1165 Type 2 diabetes mellitus with hyperglycemia: Secondary | ICD-10-CM | POA: Diagnosis not present

## 2020-07-15 DIAGNOSIS — M25512 Pain in left shoulder: Secondary | ICD-10-CM | POA: Diagnosis not present

## 2020-07-21 DIAGNOSIS — M7502 Adhesive capsulitis of left shoulder: Secondary | ICD-10-CM | POA: Diagnosis not present

## 2020-07-26 ENCOUNTER — Ambulatory Visit (INDEPENDENT_AMBULATORY_CARE_PROVIDER_SITE_OTHER): Payer: BC Managed Care – PPO | Admitting: Neurology

## 2020-07-26 DIAGNOSIS — Z794 Long term (current) use of insulin: Secondary | ICD-10-CM

## 2020-07-26 DIAGNOSIS — G4719 Other hypersomnia: Secondary | ICD-10-CM

## 2020-07-26 DIAGNOSIS — Z8669 Personal history of other diseases of the nervous system and sense organs: Secondary | ICD-10-CM

## 2020-07-26 DIAGNOSIS — E1111 Type 2 diabetes mellitus with ketoacidosis with coma: Secondary | ICD-10-CM

## 2020-07-26 DIAGNOSIS — G478 Other sleep disorders: Secondary | ICD-10-CM

## 2020-07-26 DIAGNOSIS — G4733 Obstructive sleep apnea (adult) (pediatric): Secondary | ICD-10-CM | POA: Diagnosis not present

## 2020-07-26 DIAGNOSIS — U071 COVID-19: Secondary | ICD-10-CM

## 2020-07-26 DIAGNOSIS — E669 Obesity, unspecified: Secondary | ICD-10-CM

## 2020-07-29 NOTE — Progress Notes (Signed)
   [facility]  HOME SLEEP TEST (Watch PAT)  STUDY DATA Loaded on: 07/29/20  DOB: 09-13-1975  MRN: [id number]  ORDERING CLINICIAN: Melvyn Novas, MD   REFERRING CLINICIAN: Creola Corn, MD   CLINICAL INFORMATION/HISTORY: Benjamin Rockett Reeseis a 45 - year- old male patientand seenon 07/05/2020  concer about EDS- excessive daytime sleepiness. He now needs naps and he feels he is not always safe to drive, ready to doze off. He had a huge weight loss due to COVID ,  [facility]- ICU for 8 days in 12/ 2020. He has not ben on CPAP. Non restorative sleep now.   [name] a previous diagnosis of OSA and Covid related DKA , ICU hospitalization., SOB- breathing disorder. He has a past medical history of Vitiligo, ADD (attention deficit disorder) (02/23/2018),  OSA 2012, Obesity and Depression (02/23/2018), and Diabetes type I (HCC).  Epworth sleepiness score: 14-16 /24.  BMI: 34.3 kg/m  Neck Circumference: 16.75 "  FINDINGS:   Total Record Time (hours, min): 7 h 36 min  Total Sleep Time (hours, min):  6 h 44 min   Percent REM (%):    N/A   Calculated pAHI (per hour): 43.4       Supine AHI: 51.1   Oxygen Saturation (%) Mean: 95  Minimum oxygen saturation (%):        81   O2 Saturation Range (%): 81-99  O2Saturation (minutes) <=88%: 3.1 min  Pulse Mean (bpm):    84  Pulse Range (65-107)   IMPRESSION: This HST confirms the presence of severe  OSA (obstructive sleep apnea) at AHI of 43/h and supine AHI was 51/h. Loud snoring. No prolonged hypoxemia noted, normal heart rate variability was seen.  REM sleep AHI was not calculated.   RECOMMENDATION:  CPAP is needed to treat high AHI / severe OSA, but the patient may actually tolerate BiPAP better. We will start with auto CPAP 6-18 cm water, 2 cm EPR and heated humidification, mask of his choice.    INTERPRETING PHYSICIAN:  Melvyn Novas, MD  [facility] and [facility] Sleep Board certified by [employer]  of Sleep Medicine and Fellow of the [employer] of Neu[employer]. Medical Director of [employer].

## 2020-08-12 ENCOUNTER — Telehealth: Payer: Self-pay | Admitting: Neurology

## 2020-08-12 NOTE — Progress Notes (Signed)
  IMPRESSION: This HST confirms the presence of severe  OSA (obstructive sleep apnea) at AHI of 43/h and supine AHI was 51/h. Loud snoring. No prolonged hypoxemia noted, normal heart rate variability was seen.  REM sleep AHI was not calculated.   RECOMMENDATION:  CPAP is needed to treat high AHI / severe OSA, but the patient may actually tolerate BiPAP better. We will start with auto CPAP 6-18 cm water, 2 cm EPR and heated humidification, mask of his choice.    INTERPRETING PHYSICIAN:  Melvyn Novas, MD [facility] and [facility] Sleep

## 2020-08-12 NOTE — Procedures (Signed)
[facility]  HOME SLEEP TEST (Watch PAT)  STUDY DATA Loaded on: 07/29/20  DOB: 02-Jun-1976  MRN: [id number]  ORDERING CLINICIAN: Melvyn Novas, MD   REFERRING CLINICIAN: Creola Corn, MD   CLINICAL INFORMATION/HISTORY: Benjamin Ned Reeseis a 45 - year- old male patientand seenon 07/05/2020  concer about EDS- excessive daytime sleepiness. He now needs naps and he feels he is not always safe to drive, ready to doze off. He had a huge weight loss due to COVID ,  [facility]- ICU for 8 days in 12/ 2020. He has not ben on CPAP. Non restorative sleep now.   Benjamin [name] a previous diagnosis of OSA and Covid related DKA , ICU hospitalization., SOB- breathing disorder. He has a past medical history of Vitiligo, ADD (attention deficit disorder) (02/23/2018),  OSA 2012, Obesity and Depression (02/23/2018), and Diabetes type I (HCC).  Epworth sleepiness score: 14-16 /24.  BMI: 34.3 kg/m  Neck Circumference: 16.75 "  FINDINGS:   Total Record Time (hours, min): 7 h 36 min  Total Sleep Time (hours, min):  6 h 44 min   Percent REM (%):    N/A   Calculated pAHI (per hour): 43.4       Supine AHI: 51.1   Oxygen Saturation (%) Mean: 95  Minimum oxygen saturation (%):        81   O2 Saturation Range (%): 81-99  O2Saturation (minutes) <=88%: 3.1 min  Pulse Mean (bpm):    84  Pulse Range (65-107)   IMPRESSION: This HST confirms the presence of severe  OSA (obstructive sleep apnea) at AHI of 43/h and supine AHI was 51/h. Loud snoring. No prolonged hypoxemia noted, normal heart rate variability was seen.  REM sleep AHI was not calculated.   RECOMMENDATION:  CPAP is needed to treat high AHI / severe OSA, but the patient may actually tolerate BiPAP better. We will start with auto CPAP 6-18 cm water, 2 cm EPR and heated humidification, mask of his choice.    INTERPRETING PHYSICIAN:  Melvyn Novas, MD  [facility] and [facility] Sleep Board certified by [employer] of  Sleep Medicine and Fellow of the [employer]. Medical Director of [employer].

## 2020-08-12 NOTE — Telephone Encounter (Signed)
I called pt. I advised pt that Dr. Vickey Huger reviewed their sleep study results and found that pt has severe sleep apnea. Dr. Vickey Huger recommends that pt start auto CPAP. I reviewed PAP compliance expectations with the pt. Pt is agreeable to starting a CPAP. I advised pt that an order will be sent to a DME, [employer], and [employer] will call the pt within about one week after they file with the pt's insurance. [employer] will show the pt how to use the machine, fit for masks, and troubleshoot the CPAP if needed. A follow up appt will need to be made for insurance purposes with Dr Dohmeier or the NP 31-90 days from the date he is set up with the machine. A letter with all of this information in it will be mailed to the pt as a reminder. I verified with the pt that the address we have on file is correct. Pt verbalized understanding of results. Pt had no questions at this time but was encouraged to call back if questions arise. I have sent the order to [employer] and have received confirmation that they have received the order.

## 2020-08-12 NOTE — Telephone Encounter (Signed)
-----   Message from Melvyn Novas, MD sent at 08/12/2020  1:44 PM EST -----  IMPRESSION: This HST confirms the presence of severe  OSA (obstructive sleep apnea) at AHI of 43/h and supine AHI was 51/h. Loud snoring. No prolonged hypoxemia noted, normal heart rate variability was seen.  REM sleep AHI was not calculated.   RECOMMENDATION:  CPAP is needed to treat high AHI / severe OSA, but the patient may actually tolerate BiPAP better. We will start with auto CPAP 6-18 cm water, 2 cm EPR and heated humidification, mask of his choice.    INTERPRETING PHYSICIAN:  Melvyn Novas, MD [facility] Sleep

## 2020-08-12 NOTE — Addendum Note (Signed)
Addended by: Melvyn Novas on: 08/12/2020 01:44 PM   Modules accepted: Orders

## 2020-08-19 DIAGNOSIS — E113393 Type 2 diabetes mellitus with moderate nonproliferative diabetic retinopathy without macular edema, bilateral: Secondary | ICD-10-CM | POA: Diagnosis not present

## 2020-08-25 DIAGNOSIS — E119 Type 2 diabetes mellitus without complications: Secondary | ICD-10-CM | POA: Diagnosis not present

## 2020-08-25 DIAGNOSIS — Z794 Long term (current) use of insulin: Secondary | ICD-10-CM | POA: Diagnosis not present

## 2020-08-25 DIAGNOSIS — E1165 Type 2 diabetes mellitus with hyperglycemia: Secondary | ICD-10-CM | POA: Diagnosis not present

## 2020-08-26 DIAGNOSIS — E119 Type 2 diabetes mellitus without complications: Secondary | ICD-10-CM | POA: Diagnosis not present

## 2020-08-26 DIAGNOSIS — Z794 Long term (current) use of insulin: Secondary | ICD-10-CM | POA: Diagnosis not present

## 2020-08-30 DIAGNOSIS — I129 Hypertensive chronic kidney disease with stage 1 through stage 4 chronic kidney disease, or unspecified chronic kidney disease: Secondary | ICD-10-CM | POA: Diagnosis not present

## 2020-08-30 DIAGNOSIS — E1022 Type 1 diabetes mellitus with diabetic chronic kidney disease: Secondary | ICD-10-CM | POA: Diagnosis not present

## 2020-10-06 DIAGNOSIS — E1165 Type 2 diabetes mellitus with hyperglycemia: Secondary | ICD-10-CM | POA: Diagnosis not present

## 2020-11-02 DIAGNOSIS — E1022 Type 1 diabetes mellitus with diabetic chronic kidney disease: Secondary | ICD-10-CM | POA: Diagnosis not present

## 2020-11-03 DIAGNOSIS — E119 Type 2 diabetes mellitus without complications: Secondary | ICD-10-CM | POA: Diagnosis not present

## 2020-11-03 DIAGNOSIS — Z794 Long term (current) use of insulin: Secondary | ICD-10-CM | POA: Diagnosis not present

## 2020-11-03 DIAGNOSIS — E1165 Type 2 diabetes mellitus with hyperglycemia: Secondary | ICD-10-CM | POA: Diagnosis not present

## 2020-11-15 DIAGNOSIS — H40013 Open angle with borderline findings, low risk, bilateral: Secondary | ICD-10-CM | POA: Diagnosis not present

## 2020-11-26 DIAGNOSIS — E1165 Type 2 diabetes mellitus with hyperglycemia: Secondary | ICD-10-CM | POA: Diagnosis not present

## 2020-11-26 DIAGNOSIS — G4733 Obstructive sleep apnea (adult) (pediatric): Secondary | ICD-10-CM | POA: Diagnosis not present

## 2020-11-26 DIAGNOSIS — Z794 Long term (current) use of insulin: Secondary | ICD-10-CM | POA: Diagnosis not present

## 2020-11-26 DIAGNOSIS — E119 Type 2 diabetes mellitus without complications: Secondary | ICD-10-CM | POA: Diagnosis not present

## 2020-12-03 DIAGNOSIS — G4733 Obstructive sleep apnea (adult) (pediatric): Secondary | ICD-10-CM | POA: Diagnosis not present

## 2020-12-10 ENCOUNTER — Encounter (HOSPITAL_BASED_OUTPATIENT_CLINIC_OR_DEPARTMENT_OTHER): Payer: Self-pay | Admitting: Emergency Medicine

## 2020-12-10 ENCOUNTER — Emergency Department (HOSPITAL_BASED_OUTPATIENT_CLINIC_OR_DEPARTMENT_OTHER)
Admission: EM | Admit: 2020-12-10 | Discharge: 2020-12-11 | Disposition: A | Discharge status: Home / Self Care | Payer: BC Managed Care – PPO | Attending: Emergency Medicine | Admitting: Emergency Medicine

## 2020-12-10 ENCOUNTER — Other Ambulatory Visit: Payer: Self-pay

## 2020-12-10 DIAGNOSIS — Z79899 Other long term (current) drug therapy: Secondary | ICD-10-CM | POA: Insufficient documentation

## 2020-12-10 DIAGNOSIS — Z794 Long term (current) use of insulin: Secondary | ICD-10-CM | POA: Insufficient documentation

## 2020-12-10 DIAGNOSIS — E119 Type 2 diabetes mellitus without complications: Secondary | ICD-10-CM | POA: Diagnosis not present

## 2020-12-10 DIAGNOSIS — L509 Urticaria, unspecified: Secondary | ICD-10-CM | POA: Diagnosis not present

## 2020-12-10 DIAGNOSIS — T7840XA Allergy, unspecified, initial encounter: Secondary | ICD-10-CM | POA: Insufficient documentation

## 2020-12-10 DIAGNOSIS — Z8616 Personal history of COVID-19: Secondary | ICD-10-CM | POA: Diagnosis not present

## 2020-12-10 DIAGNOSIS — R21 Rash and other nonspecific skin eruption: Secondary | ICD-10-CM | POA: Diagnosis not present

## 2020-12-10 MED ORDER — DIPHENHYDRAMINE HCL 50 MG/ML IJ SOLN: 25.0000 mg | Freq: Once | INTRAMUSCULAR | Status: DC

## 2020-12-10 MED ORDER — METHYLPREDNISOLONE SODIUM SUCC 125 MG IJ SOLR
125.0000 mg | Freq: Once | INTRAMUSCULAR | Status: AC
  Administered 2020-12-10: 125 mg via INTRAVENOUS
  Filled 2020-12-10: qty 2

## 2020-12-10 MED ORDER — FAMOTIDINE IN NACL 20-0.9 MG/50ML-% IV SOLN
20.0000 mg | Freq: Once | INTRAVENOUS | Status: AC
  Administered 2020-12-10: 20 mg via INTRAVENOUS
  Filled 2020-12-10: qty 50

## 2020-12-10 NOTE — ED Triage Notes (Signed)
Pt states hives and itching X 30 min has known allergy to bees but no know exposure at this time, does have 3 potential unknown bites.

## 2020-12-11 ENCOUNTER — Encounter (HOSPITAL_BASED_OUTPATIENT_CLINIC_OR_DEPARTMENT_OTHER): Payer: Self-pay | Admitting: Emergency Medicine

## 2020-12-11 MED ORDER — PREDNISONE 20 MG PO TABS: ORAL_TABLET | ORAL | 0 refills | Status: DC

## 2020-12-11 MED ORDER — FAMOTIDINE 20 MG PO TABS: 20.0000 mg | ORAL_TABLET | Freq: Two times a day (BID) | ORAL | 0 refills | Status: DC

## 2020-12-11 NOTE — ED Notes (Signed)
Pt. Skin looks better after pepcid infused and Pt. Has no noted signs of resp. Distress.  Pt. Has call bell at bedside with no s/s of tongue swelling and is able to swallow with no drooling noted.

## 2020-12-11 NOTE — ED Notes (Signed)
Pt. Reports he has no idea what he came in contact with approx. to 1 hour ago.  Pt. Has redness with hives and red small bumps noted from neck to toes.  No c/o resp. Distress.

## 2020-12-11 NOTE — ED Provider Notes (Signed)
[facility] EMERGENCY DEPARTMENT Provider Note   CSN: [id number] Arrival date & time: 12/10/20  2304     History Chief Complaint  Patient presents with   Allergic Reaction    Benjamin [name] is a 45 y.o. male.  The history is provided by the patient.  Allergic Reaction Presenting symptoms: itching and rash   Presenting symptoms: no difficulty breathing, no difficulty swallowing, no swelling and no wheezing   Severity:  Moderate Duration:  1 hour Prior allergic episodes:  Insect allergies Context: not animal exposure, not chemicals, not cosmetics, not dairy/milk products and not eggs   Relieved by:  Antihistamines Worsened by:  Nothing Ineffective treatments:  Antihistamines Hives worst on the abdomen and waist band area sparing head neck and arms.  Took benadryl at home with some improvement.      Past Medical History:  Diagnosis Date   ADD (attention deficit disorder) 02/23/2018   Depression 02/23/2018   Diabetes type I [facility])     Patient Active Problem List   Diagnosis Date Noted   Excessive daytime sleepiness 07/05/2020   Non-restorative sleep 07/05/2020   History of obstructive sleep apnea 07/05/2020   Type 2 diabetes mellitus with ketoacidosis and coma, with long-term current use of insulin (HCC) 07/05/2020   Obesity (BMI 30.0-34.9) 07/05/2020   DKA (diabetic ketoacidoses) 05/29/2019   COVID-19 virus infection 05/29/2019   Attention deficit hyperactivity disorder (ADHD), combined type, moderate 02/23/2018   Persistent depressive disorder with atypical features, currently mild 02/23/2018    History reviewed. No pertinent surgical history.     History reviewed. No pertinent family history.  Social History   Tobacco Use   Smoking status: Never   Smokeless tobacco: Never  Vaping Use   Vaping Use: Never used  Substance Use Topics   Alcohol use: Not Currently   Drug use: Not Currently    Types: Marijuana    Home Medications Prior to Admission  medications   Medication Sig Start Date End Date Taking? Authorizing Provider  atorvastatin (LIPITOR) 80 MG tablet Take 80 mg by mouth daily at 6 PM.  01/29/10   [provider]  Continuous Blood Gluc Sensor (FREESTYLE LIBRE 14 DAY SENSOR) MISC CHANGE EVERY 14 DAYS 12/28/18   [provider]  HUMALOG 100 UNIT/ML injection Inject into the skin. Insulin pump. 05/06/20   [provider]  Insulin Pen Needle (PEN NEEDLES 3/16") 31G X 5 MM MISC Use as directed with insulin pen 06/03/19   [place], Eric J, DO  losartan (COZAAR) 25 MG tablet Take 25 mg by mouth daily. 03/06/20   [provider]    Allergies    Bee venom and Lisinopril  Review of Systems   Review of Systems  Constitutional:  Negative for fever.  HENT:  Negative for drooling and trouble swallowing.   Eyes:  Negative for redness.  Respiratory:  Negative for shortness of breath and wheezing.   Cardiovascular:  Negative for leg swelling.  Gastrointestinal:  Negative for vomiting.  Genitourinary:  Negative for difficulty urinating.  Musculoskeletal:  Negative for neck stiffness.  Skin:  Positive for itching and rash.  Neurological:  Negative for facial asymmetry.  Psychiatric/Behavioral:  Negative for agitation.   All other systems reviewed and are negative.  Physical Exam Updated Vital Signs BP 110/89 (BP Location: Right Arm)   Pulse (!) 122   Temp 98 F (36.7 C) (Oral)   Resp 20   Ht 6\' 1"  (1.854 m)   Wt 117.9 kg  SpO2 98%   BMI 34.30 kg/m   Physical Exam Vitals and nursing note reviewed.  Constitutional:      Appearance: Normal appearance. He is not diaphoretic.  HENT:     Head: Normocephalic and atraumatic.     Nose: Nose normal.     Mouth/Throat:     Mouth: Mucous membranes are moist.     Pharynx: Oropharynx is clear.     Comments: No swelling of the lips or tongue  Eyes:     Pupils: Pupils are equal, round, and reactive to light.  Cardiovascular:     Rate and Rhythm:  Normal rate and regular rhythm.     Pulses: Normal pulses.     Heart sounds: Normal heart sounds.  Pulmonary:     Effort: Pulmonary effort is normal.     Breath sounds: Normal breath sounds.  Abdominal:     General: Bowel sounds are normal.     Palpations: Abdomen is soft.     Tenderness: There is no abdominal tenderness.  Musculoskeletal:        General: Normal range of motion.     Cervical back: Normal range of motion and neck supple.  Skin:    General: Skin is warm and dry.     Capillary Refill: Capillary refill takes less than 2 seconds.     Comments: Hives of the wait band area and lower abdomen   Neurological:     General: No focal deficit present.     Mental Status: He is alert and oriented to person, place, and time.  Psychiatric:        Mood and Affect: Mood normal.        Behavior: Behavior normal.    ED Results / Procedures / Treatments   Labs (all labs ordered are listed, but only abnormal results are displayed) Labs Reviewed - No data to display  EKG None  Radiology No results found.  Procedures Procedures   Medications Ordered in ED Medications  methylPREDNISolone sodium succinate (SOLU-MEDROL) 125 mg/2 mL injection 125 mg (125 mg Intravenous Given 12/10/20 2342)  famotidine (PEPCID) IVPB 20 mg premix (0 mg Intravenous Stopped 12/11/20 0018)    ED Course  I have reviewed the triage vital signs and the nursing notes.  Pertinent labs & imaging results that were available during my care of the patient were reviewed by me and considered in my medical decision making (see chart for details).    Symptoms markedly improved post medication.  Continue benadryl every 6 hours for 2 days will give steroids and pepcid.  Return for worsening symptoms.    Benjamin [name] was evaluated in Emergency Department on 12/11/2020 for the symptoms described in the history of present illness. He was evaluated in the context of the global COVID-19 pandemic, which necessitated  consideration that the patient might be at risk for infection with the SARS-CoV-2 virus that causes COVID-19. Institutional protocols and algorithms that pertain to the evaluation of patients at risk for COVID-19 are in a state of rapid change based on information released by regulatory bodies including the CDC and federal and state organizations. These policies and algorithms were followed during the patient's care in the ED.   Final Clinical Impression(s) / ED Diagnoses Final diagnoses:  None     Return for intractable cough, coughing up blood, fevers > 100.4 unrelieved by medication, shortness of breath, intractable vomiting, chest pain, shortness of breath, weakness, numbness, changes in speech, facial asymmetry, abdominal pain, passing out, Inability  to tolerate liquids or food, cough, altered mental status or any concerns. No signs of systemic illness or infection. The patient is nontoxic-appearing on exam and vital signs are within normal limits. I have reviewed the triage vital signs and the nursing notes. Pertinent labs & imaging results that were available during my care of the patient were reviewed by me and considered in my medical decision making (see chart for details). After history, exam, and medical workup I feel the patient has been appropriately medically screened and is safe for discharge home. Pertinent diagnoses were discussed with the patient. Patient was given return precautions.  Rx / DC Orders ED Discharge Orders     None        Ikenna Ohms, MD 12/11/20 [id number]

## 2020-12-31 DIAGNOSIS — E1022 Type 1 diabetes mellitus with diabetic chronic kidney disease: Secondary | ICD-10-CM | POA: Diagnosis not present

## 2020-12-31 DIAGNOSIS — I10 Essential (primary) hypertension: Secondary | ICD-10-CM | POA: Diagnosis not present

## 2021-01-03 DIAGNOSIS — G4733 Obstructive sleep apnea (adult) (pediatric): Secondary | ICD-10-CM | POA: Diagnosis not present

## 2021-01-18 DIAGNOSIS — F4325 Adjustment disorder with mixed disturbance of emotions and conduct: Secondary | ICD-10-CM | POA: Diagnosis not present

## 2021-01-19 DIAGNOSIS — E1165 Type 2 diabetes mellitus with hyperglycemia: Secondary | ICD-10-CM | POA: Diagnosis not present

## 2021-01-19 DIAGNOSIS — Z794 Long term (current) use of insulin: Secondary | ICD-10-CM | POA: Diagnosis not present

## 2021-01-19 DIAGNOSIS — E119 Type 2 diabetes mellitus without complications: Secondary | ICD-10-CM | POA: Diagnosis not present

## 2021-01-20 DIAGNOSIS — E1165 Type 2 diabetes mellitus with hyperglycemia: Secondary | ICD-10-CM | POA: Diagnosis not present

## 2021-01-25 DIAGNOSIS — F4325 Adjustment disorder with mixed disturbance of emotions and conduct: Secondary | ICD-10-CM | POA: Diagnosis not present

## 2021-01-31 ENCOUNTER — Telehealth: Payer: Self-pay

## 2021-01-31 NOTE — Telephone Encounter (Signed)
Called and lvm regarding tomorrow's initial cpap f/u with Dr. Vickey Huger. Not enough usage data noticed for compliance. Pt will f/u with Dr. Bea Laura.

## 2021-02-01 ENCOUNTER — Encounter: Payer: Self-pay | Admitting: Neurology

## 2021-02-01 ENCOUNTER — Ambulatory Visit (INDEPENDENT_AMBULATORY_CARE_PROVIDER_SITE_OTHER): Payer: BC Managed Care – PPO | Admitting: Neurology

## 2021-02-01 VITALS — BP 132/80 | HR 106 | Ht 73.0 in | Wt 263.0 lb

## 2021-02-01 DIAGNOSIS — G4733 Obstructive sleep apnea (adult) (pediatric): Secondary | ICD-10-CM

## 2021-02-01 DIAGNOSIS — R0683 Snoring: Secondary | ICD-10-CM | POA: Diagnosis not present

## 2021-02-01 DIAGNOSIS — Z9989 Dependence on other enabling machines and devices: Secondary | ICD-10-CM

## 2021-02-01 NOTE — Progress Notes (Signed)
SLEEP MEDICINE CLINIC    Provider:  Melvyn Novas, MD  Primary Care Physician:  Creola Corn, MD [address] [place] [address]     Referring Provider: Creola Corn, Md [address] [place],  [place] [address]          Chief Complaint according to patient   Patient presents with:     New Patient (Initial Visit)     Presents today to evaluate OSA concerns.       HISTORY OF PRESENT ILLNESS:  Benjamin [name] is a 46 - year- old Caucasian male patient and is seen on 02/01/2021 .  This is a revisit for this young patient whom I first encountered in January of this year and who underwent a home sleep test by watch pat on February 2022.  He had endorsed the Epworth sleepiness score at the time at 14-16 points, his calculated AHI per hour of sleep while on the home sleep test was 43.4/h.  Oxygen nadir was 81% REM sleep was not detected.  I ordered an auto titration between 6 and 18 cmH2O to centimeter EPR heated humidification and mask of the patient's choice.  He states now that he has used the machine compliantly but unfortunately he still has breakthrough snoring which his wife has confirmed. He is back to the gym, has regained stamina.  Weight is 260 pounds.  Facial hair breaks seal, FFM doesn't allow sleeping on his side either. That's when he snores.  FFM by [employer] is F30, not F 30 I.    Download of data from SD card- 01-31-2021 Benjamin [name] compliance is 97% 29 out of 30 days with a hourly use at time on average of 5.5 hours.  The serial number is G be to be [id number] his date of set up was that November 12, 2020.  The pressure is set between 6 and 18 cmH2O with an EPR of 2 cmH2O.  95th percentile pressure currently used is 12.2 cm water.  There is a rather high air leak has median leakage is 17.5 L/min.  Residual events per hour AHI is 4.0 no central apneas arising clearly obstructive apneas at 1.4 he does have snoring breakthrough measured in form of rare rest respiratory event  related arousals at 4.5/h.  So my goal would be still to have this patient of possible sleep on his side and to at least try if he could go with a nasal or chin cradle model.  The AHI varies from day today as does the air leak and should not require pressure so I think this is positional dependent whenever he tries to move to his side the seal breaks and he is probably snoring in supine position.    Upon sleep cpnsultation requested by Dr. Timothy Lasso. Chief concern according to patient : SLEEPINESS:  He now needs naps and he feels he is not always safe to drive, ready to doze off. He had a huge weight loss due to COVID ,  [facility]- ICU for 8 days in 12/ 2020. He has not ben on CPAP. Non restorative sleep now.   Benjamin [name], a right -handed Caucasian male with a previous diagnosis of OSA and Covid related  DKA , ICU hospitalization., SOB- breathing disorder. He has a past medical history of Vitiligo, ADD (attention deficit disorder) (02/23/2018),  OSA 2012, Obesity and Depression (02/23/2018), and Diabetes type I (HCC).   The patient had the first sleep study in the year 2012,  with  a result of OSA at a mild degree, AHI of 7.8/h. Marland Kitchen.     Sleep relevant medical history: Obesity , COVID 19     Family medical /sleep history: brother on CPAP with OSA.    Social history:  Patient is working as a Firefighterfinancial advisor- and lives in a household with spouse and 2 children. per.Pets are present. Tobacco use- none   ETOH use ; rarely , Caffeine intake in form of Coffee( throughout the day 3 times 16 ounces, ) Soda( 1 a day) Tea ( /) or energy drinks. Regular exercise- not since Covid.    Sleep habits are as follows:  The patient's dinner time is between 6-8 PM. The patient goes to bed at 10-11 PM and has not trouble to fall asleep- continues to sleep for 6-8 hours. The preferred sleep position is lateral , with the support of 2 pillows. No GERD. Dreams are reportedly frequent/vivid. 6.30  AM is the usual rise time.  The  patient wakes up with an alarm.  He reports initially  feeling refreshed and  restored in AM.  Naps are taken ifrequently, lasting from 45-60 minutes and are less refreshing than nocturnal sleep.    Review of Systems: Out of a complete 14 system review, the patient complains of only the following symptoms, and all other reviewed systems are negative.:  Fatigue, sleepiness , snoring, unfragmented sleep,non restorative   COVID 19 - oxygen stayed up. Ketoacidosis, ICU. 18-20 hour of sleep for 4 days, missing medications and food, ending dehydrated and KETO.    How likely are you to doze in the following situations: 0 = not likely, 1 = slight chance, 2 = moderate chance, 3 = high chance   Sitting and Reading? Watching Television? Sitting inactive in a public place (theater or meeting)? As a passenger in a car for an hour without a break? Lying down in the afternoon when circumstances permit? Sitting and talking to someone? Sitting quietly after lunch without alcohol? In a car, while stopped for a few minutes in traffic?   Total = before CPAP 14/ 24 points , now 10/ 24 points   FSS endorsed at 36 before CPAP and 28 with CPAP/ 63 points.   Social History   Socioeconomic History   Marital status: Married    Spouse name: Not on file   Number of children: Not on file   Years of education: Not on file   Highest education level: Not on file  Occupational History   Not on file  Tobacco Use   Smoking status: Never   Smokeless tobacco: Never  Vaping Use   Vaping Use: Never used  Substance and Sexual Activity   Alcohol use: Not Currently   Drug use: Not Currently    Types: Marijuana   Sexual activity: Yes  Other Topics Concern   Not on file  Social History Narrative   Not on file   Social Determinants of Health   Financial Resource Strain: Not on file  Food Insecurity: Not on file  Transportation Needs: Not on file  Physical Activity: Not on file  Stress: Not on file  Social  Connections: Not on file    No family history on file.  Past Medical History:  Diagnosis Date   ADD (attention deficit disorder) 02/23/2018   Depression 02/23/2018   Diabetes type I (HCC)     Allergies  Allergen Reactions   Bee Venom    Lisinopril     Other reaction(s): COUGH Other  reaction(s): COUGH     Physical exam:  There were no vitals filed for this visit.  There is no height or weight on file to calculate BMI.   Wt Readings from Last 3 Encounters:  12/10/20 260 lb (117.9 kg)  07/05/20 254 lb (115.2 kg)  05/29/19 214 lb 1.1 oz (97.1 kg)     Ht Readings from Last 3 Encounters:  12/10/20 6\' 1"  (1.854 m)  07/05/20 6' (1.829 m)  05/29/19 6\' 1"  (1.854 m)      General: The patient is awake, alert and appears not in acute distress. The patient is well groomed. Head: Normocephalic, atraumatic. Neck is supple. Mallampati 2,  neck circumference:16. 75  Inches.  Nasal airflow  patent.   Retrognathia is not  seen.  Dental status:  Cardiovascular:  Regular rate and cardiac rhythm by pulse,  without distended neck veins. Respiratory: Lungs are clear to auscultation.  Skin:  Without evidence of ankle edema, or rash. Trunk: The patient's posture is erect.   Neurologic exam : The patient is awake and alert, oriented to place and time.   Memory subjective described as intact.  Attention span & concentration ability appears normal.  Speech is fluent,  without  dysarthria, dysphonia or aphasia.  Mood and affect are appropriate.   Cranial nerves: no loss of smell or taste reported  Pupils are equal and briskly reactive to light. Funduscopic exam deferred. .  Extraocular movements in vertical and horizontal planes were intact and without nystagmus. No Diplopia. Visual fields by finger perimetry are intact. Hearing was intact to soft voice and finger rubbing.    Facial sensation intact to fine touch.  Facial motor strength is symmetric and tongue and uvula move midline.   Neck ROM : rotation, tilt and flexion extension were normal for age and shoulder shrug was symmetrical.    Motor exam:  Symmetric bulk, tone and ROM.   Normal tone without cog-wheeling, symmetric grip strength .   Sensory:  Fine touch, pinprick and vibration were tested  and  oin and needle sensation are reported in both feet.  Proprioception tested in the upper extremities was normal.   Coordination: Rapid alternating movements in the fingers/hands were of normal speed.  The Finger-to-nose maneuver was intact without evidence of ataxia, dysmetria or tremor.   Gait and station: Patient could rise unassisted from a seated position, walked without assistive device.  Stance is of normal width/ base and the patient turned with 3 steps.  Toe and heel walk were deferred.  Deep tendon reflexes: in the  upper and lower extremities are symmetric and intact.  Babinski response was deferred .     I reviewed the old sleep study- mild OSA.  After spending a total time of 22  minutes face to face and additional time for physical and neurologic examination, review of laboratory studies,  personal review of imaging studies, reports and results of other testing and review of referral information / records as far as provided in visit, I have established the following assessments:  1)  Improved daytime sleepiness and physical activity level under CPAP. Average of 12.2 cm water 95% pressure.     My Plan is to proceed with: nasal mask and chin strap to allow sleeping non-supine.  CPAP pillow to try, I had him see a FFM Dreamwear medium halo and L mask and he really liked it. I will ask DME to order it for him, AEROFLOW-    1)  Electronically signed by: 05/31/19  Benjamin Seib, MD 02/01/2021 10:36 AM  [facility] and [employer] certified by The [employer] of Sleep Medicine and Diplomate of the [employer] of Sleep Medicine. Board certified In Neurology through the ABPN,  Fellow of the [employer] of Neurology. Medical Director of [employer].

## 2021-02-03 DIAGNOSIS — G4733 Obstructive sleep apnea (adult) (pediatric): Secondary | ICD-10-CM | POA: Diagnosis not present

## 2021-02-04 DIAGNOSIS — F4325 Adjustment disorder with mixed disturbance of emotions and conduct: Secondary | ICD-10-CM | POA: Diagnosis not present

## 2021-02-08 DIAGNOSIS — F4325 Adjustment disorder with mixed disturbance of emotions and conduct: Secondary | ICD-10-CM | POA: Diagnosis not present

## 2021-02-14 DIAGNOSIS — E1165 Type 2 diabetes mellitus with hyperglycemia: Secondary | ICD-10-CM | POA: Diagnosis not present

## 2021-02-14 DIAGNOSIS — Z794 Long term (current) use of insulin: Secondary | ICD-10-CM | POA: Diagnosis not present

## 2021-02-14 DIAGNOSIS — E119 Type 2 diabetes mellitus without complications: Secondary | ICD-10-CM | POA: Diagnosis not present

## 2021-02-16 DIAGNOSIS — F4325 Adjustment disorder with mixed disturbance of emotions and conduct: Secondary | ICD-10-CM | POA: Diagnosis not present

## 2021-02-21 DIAGNOSIS — E113393 Type 2 diabetes mellitus with moderate nonproliferative diabetic retinopathy without macular edema, bilateral: Secondary | ICD-10-CM | POA: Diagnosis not present

## 2021-02-22 DIAGNOSIS — F4325 Adjustment disorder with mixed disturbance of emotions and conduct: Secondary | ICD-10-CM | POA: Diagnosis not present

## 2021-02-28 DIAGNOSIS — E119 Type 2 diabetes mellitus without complications: Secondary | ICD-10-CM | POA: Diagnosis not present

## 2021-02-28 DIAGNOSIS — Z794 Long term (current) use of insulin: Secondary | ICD-10-CM | POA: Diagnosis not present

## 2021-02-28 DIAGNOSIS — E1165 Type 2 diabetes mellitus with hyperglycemia: Secondary | ICD-10-CM | POA: Diagnosis not present

## 2021-03-01 DIAGNOSIS — F4325 Adjustment disorder with mixed disturbance of emotions and conduct: Secondary | ICD-10-CM | POA: Diagnosis not present

## 2021-03-03 DIAGNOSIS — E1022 Type 1 diabetes mellitus with diabetic chronic kidney disease: Secondary | ICD-10-CM | POA: Diagnosis not present

## 2021-03-03 DIAGNOSIS — Z23 Encounter for immunization: Secondary | ICD-10-CM | POA: Diagnosis not present

## 2021-03-03 DIAGNOSIS — I10 Essential (primary) hypertension: Secondary | ICD-10-CM | POA: Diagnosis not present

## 2021-03-10 DIAGNOSIS — J020 Streptococcal pharyngitis: Secondary | ICD-10-CM | POA: Diagnosis not present

## 2021-03-10 DIAGNOSIS — R000 Tachycardia, unspecified: Secondary | ICD-10-CM | POA: Diagnosis not present

## 2021-03-10 DIAGNOSIS — J029 Acute pharyngitis, unspecified: Secondary | ICD-10-CM | POA: Diagnosis not present

## 2021-03-16 DIAGNOSIS — F4325 Adjustment disorder with mixed disturbance of emotions and conduct: Secondary | ICD-10-CM | POA: Diagnosis not present

## 2021-03-22 DIAGNOSIS — F4325 Adjustment disorder with mixed disturbance of emotions and conduct: Secondary | ICD-10-CM | POA: Diagnosis not present

## 2021-03-26 DIAGNOSIS — M25572 Pain in left ankle and joints of left foot: Secondary | ICD-10-CM | POA: Diagnosis not present

## 2021-03-29 DIAGNOSIS — E119 Type 2 diabetes mellitus without complications: Secondary | ICD-10-CM | POA: Diagnosis not present

## 2021-03-29 DIAGNOSIS — Z794 Long term (current) use of insulin: Secondary | ICD-10-CM | POA: Diagnosis not present

## 2021-03-29 DIAGNOSIS — E1165 Type 2 diabetes mellitus with hyperglycemia: Secondary | ICD-10-CM | POA: Diagnosis not present

## 2021-04-04 DIAGNOSIS — M25572 Pain in left ankle and joints of left foot: Secondary | ICD-10-CM | POA: Diagnosis not present

## 2021-04-05 DIAGNOSIS — F4325 Adjustment disorder with mixed disturbance of emotions and conduct: Secondary | ICD-10-CM | POA: Diagnosis not present

## 2021-04-07 DIAGNOSIS — F4325 Adjustment disorder with mixed disturbance of emotions and conduct: Secondary | ICD-10-CM | POA: Diagnosis not present

## 2021-04-12 DIAGNOSIS — F4325 Adjustment disorder with mixed disturbance of emotions and conduct: Secondary | ICD-10-CM | POA: Diagnosis not present

## 2021-04-18 DIAGNOSIS — M25572 Pain in left ankle and joints of left foot: Secondary | ICD-10-CM | POA: Diagnosis not present

## 2021-04-19 DIAGNOSIS — F4325 Adjustment disorder with mixed disturbance of emotions and conduct: Secondary | ICD-10-CM | POA: Diagnosis not present

## 2021-04-21 DIAGNOSIS — E1165 Type 2 diabetes mellitus with hyperglycemia: Secondary | ICD-10-CM | POA: Diagnosis not present

## 2021-04-26 DIAGNOSIS — F4325 Adjustment disorder with mixed disturbance of emotions and conduct: Secondary | ICD-10-CM | POA: Diagnosis not present

## 2021-05-03 DIAGNOSIS — F4325 Adjustment disorder with mixed disturbance of emotions and conduct: Secondary | ICD-10-CM | POA: Diagnosis not present

## 2021-05-04 DIAGNOSIS — E10319 Type 1 diabetes mellitus with unspecified diabetic retinopathy without macular edema: Secondary | ICD-10-CM | POA: Diagnosis not present

## 2021-05-04 DIAGNOSIS — Z125 Encounter for screening for malignant neoplasm of prostate: Secondary | ICD-10-CM | POA: Diagnosis not present

## 2021-05-17 DIAGNOSIS — F4325 Adjustment disorder with mixed disturbance of emotions and conduct: Secondary | ICD-10-CM | POA: Diagnosis not present

## 2021-05-19 DIAGNOSIS — Z1389 Encounter for screening for other disorder: Secondary | ICD-10-CM | POA: Diagnosis not present

## 2021-05-19 DIAGNOSIS — Z23 Encounter for immunization: Secondary | ICD-10-CM | POA: Diagnosis not present

## 2021-05-19 DIAGNOSIS — R82998 Other abnormal findings in urine: Secondary | ICD-10-CM | POA: Diagnosis not present

## 2021-05-19 DIAGNOSIS — Z0000 Encounter for general adult medical examination without abnormal findings: Secondary | ICD-10-CM | POA: Diagnosis not present

## 2021-05-19 DIAGNOSIS — Z1331 Encounter for screening for depression: Secondary | ICD-10-CM | POA: Diagnosis not present

## 2021-05-19 DIAGNOSIS — E1022 Type 1 diabetes mellitus with diabetic chronic kidney disease: Secondary | ICD-10-CM | POA: Diagnosis not present

## 2021-05-23 ENCOUNTER — Other Ambulatory Visit: Payer: Self-pay | Admitting: Internal Medicine

## 2021-05-23 DIAGNOSIS — E785 Hyperlipidemia, unspecified: Secondary | ICD-10-CM

## 2021-05-24 DIAGNOSIS — Z794 Long term (current) use of insulin: Secondary | ICD-10-CM | POA: Diagnosis not present

## 2021-05-24 DIAGNOSIS — E119 Type 2 diabetes mellitus without complications: Secondary | ICD-10-CM | POA: Diagnosis not present

## 2021-05-24 DIAGNOSIS — E1165 Type 2 diabetes mellitus with hyperglycemia: Secondary | ICD-10-CM | POA: Diagnosis not present

## 2021-05-25 DIAGNOSIS — H40013 Open angle with borderline findings, low risk, bilateral: Secondary | ICD-10-CM | POA: Diagnosis not present

## 2021-05-27 DIAGNOSIS — F4325 Adjustment disorder with mixed disturbance of emotions and conduct: Secondary | ICD-10-CM | POA: Diagnosis not present

## 2021-06-20 ENCOUNTER — Ambulatory Visit (INDEPENDENT_AMBULATORY_CARE_PROVIDER_SITE_OTHER): Payer: BC Managed Care – PPO | Admitting: Behavioral Health

## 2021-06-20 ENCOUNTER — Encounter: Payer: Self-pay | Admitting: Behavioral Health

## 2021-06-20 ENCOUNTER — Other Ambulatory Visit: Payer: Self-pay

## 2021-06-20 VITALS — BP 139/88 | HR 105 | Ht 73.0 in | Wt 262.0 lb

## 2021-06-20 DIAGNOSIS — F902 Attention-deficit hyperactivity disorder, combined type: Secondary | ICD-10-CM | POA: Diagnosis not present

## 2021-06-20 DIAGNOSIS — F411 Generalized anxiety disorder: Secondary | ICD-10-CM

## 2021-06-20 DIAGNOSIS — E119 Type 2 diabetes mellitus without complications: Secondary | ICD-10-CM | POA: Diagnosis not present

## 2021-06-20 DIAGNOSIS — F341 Dysthymic disorder: Secondary | ICD-10-CM

## 2021-06-20 DIAGNOSIS — Z794 Long term (current) use of insulin: Secondary | ICD-10-CM | POA: Diagnosis not present

## 2021-06-20 DIAGNOSIS — E1165 Type 2 diabetes mellitus with hyperglycemia: Secondary | ICD-10-CM | POA: Diagnosis not present

## 2021-06-20 MED ORDER — VILAZODONE HCL 20 MG PO TABS: 20.0000 mg | ORAL_TABLET | Freq: Every day | ORAL | 1 refills | Status: DC

## 2021-06-20 MED ORDER — METHYLPHENIDATE HCL ER (OSM) 27 MG PO TBCR: 27.0000 mg | EXTENDED_RELEASE_TABLET | ORAL | 0 refills | Status: DC
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
## 2021-06-22 ENCOUNTER — Ambulatory Visit
Admission: RE | Admit: 2021-06-22 | Discharge: 2021-06-22 | Disposition: A | Discharge status: Home / Self Care | Payer: No Typology Code available for payment source | Source: Ambulatory Visit | Attending: Internal Medicine | Admitting: Internal Medicine

## 2021-06-22 DIAGNOSIS — E785 Hyperlipidemia, unspecified: Secondary | ICD-10-CM

## 2021-06-28 DIAGNOSIS — G4733 Obstructive sleep apnea (adult) (pediatric): Secondary | ICD-10-CM | POA: Diagnosis not present

## 2021-07-15 DIAGNOSIS — E1165 Type 2 diabetes mellitus with hyperglycemia: Secondary | ICD-10-CM | POA: Diagnosis not present

## 2021-07-18 ENCOUNTER — Ambulatory Visit: Payer: BC Managed Care – PPO | Admitting: Behavioral Health

## 2021-07-19 ENCOUNTER — Ambulatory Visit (INDEPENDENT_AMBULATORY_CARE_PROVIDER_SITE_OTHER): Payer: BC Managed Care – PPO | Admitting: Behavioral Health

## 2021-07-19 ENCOUNTER — Encounter: Payer: Self-pay | Admitting: Behavioral Health

## 2021-07-19 ENCOUNTER — Other Ambulatory Visit: Payer: Self-pay

## 2021-07-19 DIAGNOSIS — F902 Attention-deficit hyperactivity disorder, combined type: Secondary | ICD-10-CM

## 2021-07-19 DIAGNOSIS — F411 Generalized anxiety disorder: Secondary | ICD-10-CM | POA: Diagnosis not present

## 2021-07-19 DIAGNOSIS — F341 Dysthymic disorder: Secondary | ICD-10-CM

## 2021-07-19 MED ORDER — METHYLPHENIDATE HCL ER (OSM) 27 MG PO TBCR: 27.0000 mg | EXTENDED_RELEASE_TABLET | ORAL | 0 refills | Status: DC

## 2021-07-19 MED ORDER — METHYLPHENIDATE HCL ER (OSM) 27 MG PO TBCR
27.0000 mg | EXTENDED_RELEASE_TABLET | ORAL | 0 refills | Status: DC
Start: 2021-08-19 — End: 2021-09-19

## 2021-07-19 MED ORDER — VILAZODONE HCL 20 MG PO TABS: 20.0000 mg | ORAL_TABLET | Freq: Every day | ORAL | 3 refills | Status: DC

## 2021-07-19 MED ORDER — BUPROPION HCL ER (XL) 150 MG PO TB24: 150.0000 mg | ORAL_TABLET | Freq: Every morning | ORAL | 3 refills | Status: DC

## 2021-07-19 NOTE — Progress Notes (Signed)
[facility]  Patient ID: Benjamin [name],  MRN: [id number]  PCP: Shon Baton, MD  Date of Evaluation: 07/19/2021 Time spent:20 minutes  Chief Complaint:  Chief Complaint   Anxiety; Depression; ADHD; Follow-up; Medication Refill     HISTORY/CURRENT STATUS: HPI  26, 46 year old male  is seen onsite in office face-to-face for follow up and medication management. Says that he has been doing well since initiating Viibryd. Feels like his anxiety and depression are at low manageable levels.  Says his anxiety today is 3/10 and depression is 3/10. He is sleeping 7-8 hours per night. He says that he has suicidal ideation but does not have a plan at this time. Says he loves his family to much to consider following through with suicide. He has strong family support and network of resources in which he would call. He verbally contracted for safety with this Probation officer and says that he feel safe at this time. He denies mania, no psychosis, no SI/HI. Per pt. Would like 3 month follow up.    Prior psychiatric medications: Paxil       Individual Medical History/ Review of Systems: Changes? :No   Allergies: Bee venom and Lisinopril  Current Medications:  Current Outpatient Medications:    [START ON 08/19/2021] methylphenidate (CONCERTA) 27 MG PO CR tablet, Take 1 tablet (27 mg total) by mouth every morning., Disp: 30 tablet, Rfl: 0   atorvastatin (LIPITOR) 80 MG tablet, Take 80 mg by mouth daily at 6 PM. , Disp: , Rfl:    buPROPion (WELLBUTRIN XL) 150 MG 24 hr tablet, Take 1 tablet (150 mg total) by mouth every morning., Disp: 30 tablet, Rfl: 3   Continuous Blood Gluc Sensor (FREESTYLE LIBRE 14 DAY SENSOR) MISC, CHANGE EVERY 14 DAYS (Patient not taking: Reported on 06/20/2021), Disp: , Rfl:    HUMALOG 100 UNIT/ML injection, Inject into the skin. Insulin pump., Disp: , Rfl:    losartan (COZAAR) 25 MG tablet, Take 25 mg by mouth daily., Disp: , Rfl:    [START ON 07/20/2021] methylphenidate  (CONCERTA) 27 MG PO CR tablet, Take 1 tablet (27 mg total) by mouth every morning., Disp: 30 tablet, Rfl: 0   [START ON 07/20/2021] methylphenidate (CONCERTA) 27 MG PO CR tablet, Take 1 tablet (27 mg total) by mouth every morning., Disp: 30 tablet, Rfl: 0   sildenafil (VIAGRA) 50 MG tablet, Take 50 mg by mouth daily as needed for erectile dysfunction., Disp: , Rfl:    Vilazodone HCl 20 MG TABS, Take 1 tablet (20 mg total) by mouth daily., Disp: 30 tablet, Rfl: 3 Medication Side Effects: none  Family Medical/ Social History: Changes? No  MENTAL HEALTH EXAM:  There were no vitals taken for this visit.There is no height or weight on file to calculate BMI.  General Appearance: Casual and Neat  Eye Contact:  Good  Speech:  Clear and Coherent  Volume:  Normal  Mood:  Angry  Affect:  Appropriate  Thought Process:  Coherent  Orientation:  Full (Time, Place, and Person)  Thought Content: Logical   Suicidal Thoughts:  No  Homicidal Thoughts:  No  Memory:  WNL  Judgement:  Good  Insight:  Good  Psychomotor Activity:  Normal  Concentration:  Concentration: Good  Recall:  Good  Fund of Knowledge: Good  Language: Good  Assets:  Desire for Improvement  ADL's:  Intact  Cognition: WNL  Prognosis:  Good    DIAGNOSES:    ICD-10-CM   1. Generalized anxiety  disorder  F41.1 buPROPion (WELLBUTRIN XL) 150 MG 24 hr tablet    Vilazodone HCl 20 MG TABS    2. Persistent depressive disorder with atypical features, currently moderate  F34.1 buPROPion (WELLBUTRIN XL) 150 MG 24 hr tablet    Vilazodone HCl 20 MG TABS    3. Attention deficit hyperactivity disorder (ADHD), combined type  F90.2 methylphenidate (CONCERTA) 27 MG PO CR tablet    methylphenidate (CONCERTA) 27 MG PO CR tablet      Receiving Psychotherapy: Yes    RECOMMENDATIONS:  Greater than 50% of face to face time with patient was spent on counseling and coordination of care. We discussed his long hx of anxiety and depression. He was  also previously treated in this office by Dr. Mellody Drown for ADHD . He is currently receiving therapy and counseling with [place] Dom. Discussed his previous tx plan and new goals for tx in reestablishing care.  We agreed for him to continue Concerta, 27 mg once daily. Will continue Wellbutrin 150 mg XL daily. To continue Viibryd 20  mg tablet daily. Will report side effects or worsening symptoms promptly To follow up in 4 weeks to reassess. Discussed potential benefits, risks, and side effects of stimulants with patient to include increased heart rate, palpitations, insomnia, increased anxiety, increased irritability, or decreased appetite.  Instructed patient to contact office if experiencing any significant tolerability issues.  Reviewed PDMP           Elwanda Brooklyn, NP

## 2021-07-20 ENCOUNTER — Telehealth: Payer: Self-pay

## 2021-07-20 DIAGNOSIS — E1022 Type 1 diabetes mellitus with diabetic chronic kidney disease: Secondary | ICD-10-CM | POA: Diagnosis not present

## 2021-07-20 NOTE — Telephone Encounter (Signed)
Prior Authorization submitted and approved for VILAZODONE 20 MG effective 06/20/2021-07/20/2022 with [employer] ID# [id number], PA# [id number]  [facility]

## 2021-07-26 ENCOUNTER — Telehealth: Payer: Self-pay

## 2021-07-26 DIAGNOSIS — F900 Attention-deficit hyperactivity disorder, predominantly inattentive type: Secondary | ICD-10-CM | POA: Diagnosis not present

## 2021-07-26 DIAGNOSIS — F4325 Adjustment disorder with mixed disturbance of emotions and conduct: Secondary | ICD-10-CM | POA: Diagnosis not present

## 2021-07-26 NOTE — Telephone Encounter (Signed)
Cpap data not found online. LVM for pt to bring cpap/power cord to appt.

## 2021-07-27 ENCOUNTER — Ambulatory Visit (INDEPENDENT_AMBULATORY_CARE_PROVIDER_SITE_OTHER): Payer: BC Managed Care – PPO | Admitting: Neurology

## 2021-07-27 ENCOUNTER — Encounter: Payer: Self-pay | Admitting: Neurology

## 2021-07-27 VITALS — BP 150/92 | HR 101 | Ht 73.0 in | Wt 266.0 lb

## 2021-07-27 DIAGNOSIS — E1111 Type 2 diabetes mellitus with ketoacidosis with coma: Secondary | ICD-10-CM

## 2021-07-27 DIAGNOSIS — Z794 Long term (current) use of insulin: Secondary | ICD-10-CM

## 2021-07-27 DIAGNOSIS — Z9989 Dependence on other enabling machines and devices: Secondary | ICD-10-CM

## 2021-07-27 DIAGNOSIS — E66811 Obesity, class 1: Secondary | ICD-10-CM

## 2021-07-27 DIAGNOSIS — E669 Obesity, unspecified: Secondary | ICD-10-CM | POA: Diagnosis not present

## 2021-07-27 DIAGNOSIS — G4733 Obstructive sleep apnea (adult) (pediatric): Secondary | ICD-10-CM | POA: Diagnosis not present

## 2021-07-27 NOTE — Patient Instructions (Signed)

## 2021-07-27 NOTE — Progress Notes (Signed)
SLEEP MEDICINE CLINIC    Provider:  Melvyn Novas, MD  Primary Care Physician:  Creola Corn, MD [address] [place] [address]     Referring Provider: Creola Corn, Md [address] [place],  [place] [address]          Chief Complaint according to patient   Patient presents with:     New Patient (Initial Visit)     Presents today to evaluate OSA concerns.       HISTORY OF PRESENT ILLNESS:  Benjamin [name] is Benjamin 46 - year- old Caucasian male patient and is seen on 07/27/2021 . RV with new [name]:  Benjamin [name] confirms that he feels he sleeps better than before [name] was implemented, he is generally happy with his new [name] which is Benjamin [name].  His Epworth Sleepiness Scale has been reduced to 9 points, he does not report excessive fatigue.  His compliance is excellent so he is using the I breeze 28 [name] with the serial number GB-to be [id number] for the last 8 months and 4 days now and his 95th percentile pressure in [name] is 8.8 cm water.  His residual AHI is 2.9/h which is excellent he has no central apneas arising.  Occasionally he may still snore through the [name] this is reflected in the rare average 4.5 per night.  He does have mediocre air symptoms actually his air leaks have increased since 11 February and that is when he is started using Benjamin [name], same bottle supposedly same size but [name] and headgear here refreshed at the time and I do wonder if it is not set to his best advantage.  The average pressure is 8.8 cm water and he has been compliant at 93% 4 hours and days which is an excellent compliance. He works out 3-5 days Benjamin week, but had gained weight until December.  Travel [name] and inspire [name] were discussed. He is not qualified by [facility] I think he will do better with Benjamin [name]. Its personal travel , not job related. He may entertain Benjamin [name],     This is Benjamin [name] for this young patient whom I first encountered in January of this year 2022 and  who underwent Benjamin home sleep test by watch pat on February 2022.  He had endorsed the Epworth sleepiness score at the time at 14-16 points, his calculated AHI per hour of sleep while on the home sleep test was 43.4/h.  Oxygen nadir was 81% , REM sleep was not detected.  I ordered an auto titration between 6 and 18 cmH2O to centimeter EPR heated humidification and [name] of the patient's choice.  He states now that he has used the [name] compliantly but unfortunately he still has breakthrough snoring which his wife has confirmed. He is back to the gym, has regained stamina.  Weight is 260 pounds.  Facial hair breaks seal, FFM doesn't allow sleeping on his side either. That's when he snores.  FFM by [employer] is F30, not F 30 I.    Download of data from SD card- 01-31-2021 Benjamin [name] compliance is 97% 29 out of 30 days with Benjamin hourly use at time on average of 5.5 hours.  The serial number is G be to be [id number] his date of set up was that November 12, 2020.  The pressure is set between 6 and 18 cmH2O with an EPR of 2 cmH2O.  95th percentile pressure currently used is 12.2  cm water.  There is Benjamin rather high air leak has median leakage is 17.5 L/min.  Residual events per hour AHI is 4.0 no central apneas arising clearly obstructive apneas at 1.4 he does have snoring breakthrough measured in form of rare rest respiratory event related arousals at 4.5/h.  So my goal would be still to have this patient of possible sleep on his side and to at least try if he could go with Benjamin nasal or chin cradle model.  The AHI varies from day today as does the air leak and should not require pressure so I think this is positional dependent whenever he tries to move to his side the seal breaks and he is probably snoring in supine position.    Upon sleep cpnsultation requested by Dr. Virgina Jock. Chief concern according to patient : SLEEPINESS:  He now needs naps and he feels he is not always safe to drive, ready to doze off. He had Benjamin huge weight  loss due to [place] ,  [facility]- Benjamin for 8 days in 12/ 2020. He has not ben on [name]. Non restorative sleep now.   Benjamin [name], Benjamin right -handed Caucasian male with Benjamin previous diagnosis of OSA and Covid related  [name] , Benjamin hospitalization., SOB- breathing disorder. He has Benjamin past medical history of Vitiligo, ADD (attention deficit disorder) (02/23/2018),  OSA 2012, Obesity and Depression (02/23/2018), and Diabetes type I ([place]).   The patient had the first sleep study in the year 2012,  with Benjamin result of OSA at Benjamin mild degree, AHI of 7.8/h. Marland Kitchen     Sleep relevant medical history: Obesity , COVID 19     Family medical /sleep history: brother on [name] with OSA.    Social history:  Patient is working as Benjamin Music therapist- and lives in Benjamin household with spouse and 2 children. per.Pets are present. Tobacco use- none   ETOH use ; rarely , Caffeine intake in form of Coffee( throughout the day 3 times 16 ounces, ) Soda( 1 Benjamin day) Tea ( /) or energy drinks. Regular exercise- not since Covid.    Sleep habits are as follows:  The patient's dinner time is between 6-8 PM. The patient goes to bed at 10-11 PM and has not trouble to fall asleep- continues to sleep for 6-8 hours. The preferred sleep position is lateral , with the support of 2 pillows. No GERD. Dreams are reportedly frequent/vivid. 6.30  AM is the usual rise time.  The patient wakes up with an alarm.  He reports initially  feeling refreshed and  restored in AM.  Naps are taken ifrequently, lasting from 45-60 minutes and are less refreshing than nocturnal sleep.    Review of Systems: Out of Benjamin complete 14 system review, the patient complains of only the following symptoms, and all other reviewed systems are negative.:  Fatigue, sleepiness , snoring, unfragmented sleep,non restorative   COVID 19 - oxygen stayed up. Ketoacidosis, Benjamin. 18-20 hour of sleep for 4 days, missing medications and food, ending dehydrated and KETO.    How likely are you to doze in the  following situations: 0 = not likely, 1 = slight chance, 2 = moderate chance, 3 = high chance   Sitting and Reading? Watching Television? Sitting inactive in Benjamin public place (theater or meeting)? As Benjamin passenger in Benjamin car for an hour without Benjamin break? Lying down in the afternoon when circumstances permit? Sitting and talking to someone? Sitting quietly after lunch without alcohol?  In Benjamin car, while stopped for Benjamin few minutes in traffic?   Total = before [name] 14/ 24 points , now 10/ 24 points   FSS endorsed at 37 before [name] and 28 with [name]/ 63 points.   Social History   Socioeconomic History   Marital status: Married    Spouse name: Not on file   Number of children: Not on file   Years of education: Not on file   Highest education level: Not on file  Occupational History   Not on file  Tobacco Use   Smoking status: Never   Smokeless tobacco: Never  Vaping Use   Vaping Use: Never used  Substance and Sexual Activity   Alcohol use: Not Currently   Drug use: Not Currently    Types: Marijuana   Sexual activity: Yes  Other Topics Concern   Not on file  Social History Narrative   Not on file   Social Determinants of Health   Financial Resource Strain: Not on file  Food Insecurity: Not on file  Transportation Needs: Not on file  Physical Activity: Not on file  Stress: Not on file  Social Connections: Not on file    History reviewed. No pertinent family history.  Past Medical History:  Diagnosis Date   ADD (attention deficit disorder) 02/23/2018   Depression 02/23/2018   Diabetes type I ([place])     Allergies  Allergen Reactions   Bee Venom    Lisinopril     Other reaction(s): COUGH Other reaction(s): COUGH     Physical exam:  Today's Vitals   07/27/21 1032  BP: (!) 150/92  Pulse: (!) 101  Weight: 266 lb (120.7 kg)  Height: 6\' 1"  (1.854 m)    Body mass index is 35.09 kg/m.   Wt Readings from Last 3 Encounters:  07/27/21 266 lb (120.7 kg)  02/01/21 263 lb  (119.3 kg)  12/10/20 260 lb (117.9 kg)     Ht Readings from Last 3 Encounters:  07/27/21 6\' 1"  (1.854 m)  02/01/21 6\' 1"  (1.854 m)  12/10/20 6\' 1"  (1.854 m)      General: The patient is awake, alert and appears not in acute distress. The patient is well groomed. Head: Normocephalic, atraumatic. Neck is supple. Mallampati 2,  neck circumference:16. 75  Inches.  Nasal airflow  patent.   Retrognathia is not  seen.  Dental status:  Cardiovascular:  Regular rate and cardiac rhythm by pulse,  without distended neck veins. Respiratory: Lungs are clear to auscultation.  Skin:  Without evidence of ankle edema, or rash. Trunk: The patient's posture is erect.   Neurologic exam : The patient is awake and alert, oriented to place and time.   Memory subjective described as intact.  Attention span & concentration ability appears normal.  Speech is fluent,  without  dysarthria, dysphonia or aphasia.  Mood and affect are appropriate.   Cranial nerves: no loss of smell or taste reported  Pupils are equal and briskly reactive to light. Funduscopic exam deferred. .  Extraocular movements in vertical and horizontal planes were intact and without nystagmus. No Diplopia. Visual fields by finger perimetry are intact. Hearing was intact to soft voice and finger rubbing.    Facial sensation intact to fine touch.  Facial motor strength is symmetric and tongue and uvula move midline.  Neck ROM : rotation, tilt and flexion extension were normal for age and shoulder shrug was symmetrical.    Motor exam:  Symmetric bulk, tone and ROM.  Normal tone without cog-wheeling, symmetric grip strength .   Sensory:  Fine touch, pinprick and vibration were tested  and  oin and needle sensation are reported in both feet.  Proprioception tested in the upper extremities was normal.   Coordination: Rapid alternating movements in the fingers/hands were of normal speed.  The Finger-to-nose maneuver was intact without  evidence of ataxia, dysmetria or tremor.   Gait and station: Patient could rise unassisted from Benjamin seated position, walked without assistive [name].  Stance is of normal width/ base and the patient turned with 3 steps.  Toe and heel walk were deferred.  Deep tendon reflexes: in the  upper and lower extremities are symmetric and intact.  Babinski response was deferred .    Rv 15 minutes for compliance : [name] user .   1)  Improved daytime sleepiness and physical activity level under  compliant [name] use .    My Plan is to proceed with: I strongly encouraged Benjamin nasal [name] and chin strap to allow sleeping non-supine.  continue [name] as currently ordered, Rv in q 12 months with NP/     1)  Electronically signed by: Larey Seat, MD 07/27/2021 10:56 AM  [facility] and [facility] Sleep Board certified by The [employer] of Sleep Medicine and Diplomate of the [employer] of Sleep Medicine. Board certified In Neurology through the [place], Fellow of the [employer] of Neurology. Medical Director of [employer].

## 2021-08-02 DIAGNOSIS — F4325 Adjustment disorder with mixed disturbance of emotions and conduct: Secondary | ICD-10-CM | POA: Diagnosis not present

## 2021-08-02 DIAGNOSIS — F900 Attention-deficit hyperactivity disorder, predominantly inattentive type: Secondary | ICD-10-CM | POA: Diagnosis not present

## 2021-08-03 ENCOUNTER — Ambulatory Visit: Payer: BC Managed Care – PPO | Admitting: Neurology

## 2021-08-03 ENCOUNTER — Other Ambulatory Visit: Payer: Self-pay | Admitting: Behavioral Health

## 2021-08-03 DIAGNOSIS — F902 Attention-deficit hyperactivity disorder, combined type: Secondary | ICD-10-CM

## 2021-08-03 NOTE — Telephone Encounter (Signed)
Next visit is 10/17/21. Benjamin [name] called and needs his Concerta RX redirected from  ? ?[id number] - [address] [place], [place] - [address] ? ?Phone:  [phone]  ?Fax:  [phone]  ? ? ?To: ? ?[facility] [place], [place] ? ?Phone:  [phone]  ?Fax:  [phone]  ? ? ?[name] checked with [facility] and they do have the Concerta in stock.  ? ? ? ? ? ? ?

## 2021-08-04 MED ORDER — METHYLPHENIDATE HCL ER (OSM) 27 MG PO TBCR: 27.0000 mg | EXTENDED_RELEASE_TABLET | ORAL | 0 refills | Status: DC

## 2021-08-09 DIAGNOSIS — F900 Attention-deficit hyperactivity disorder, predominantly inattentive type: Secondary | ICD-10-CM | POA: Diagnosis not present

## 2021-08-09 DIAGNOSIS — F4325 Adjustment disorder with mixed disturbance of emotions and conduct: Secondary | ICD-10-CM | POA: Diagnosis not present

## 2021-08-16 DIAGNOSIS — F900 Attention-deficit hyperactivity disorder, predominantly inattentive type: Secondary | ICD-10-CM | POA: Diagnosis not present

## 2021-08-16 DIAGNOSIS — F4325 Adjustment disorder with mixed disturbance of emotions and conduct: Secondary | ICD-10-CM | POA: Diagnosis not present

## 2021-08-22 DIAGNOSIS — E1165 Type 2 diabetes mellitus with hyperglycemia: Secondary | ICD-10-CM | POA: Diagnosis not present

## 2021-08-22 DIAGNOSIS — Z794 Long term (current) use of insulin: Secondary | ICD-10-CM | POA: Diagnosis not present

## 2021-08-22 DIAGNOSIS — E119 Type 2 diabetes mellitus without complications: Secondary | ICD-10-CM | POA: Diagnosis not present

## 2021-08-23 DIAGNOSIS — F900 Attention-deficit hyperactivity disorder, predominantly inattentive type: Secondary | ICD-10-CM | POA: Diagnosis not present

## 2021-08-23 DIAGNOSIS — E119 Type 2 diabetes mellitus without complications: Secondary | ICD-10-CM | POA: Diagnosis not present

## 2021-08-23 DIAGNOSIS — E1165 Type 2 diabetes mellitus with hyperglycemia: Secondary | ICD-10-CM | POA: Diagnosis not present

## 2021-08-23 DIAGNOSIS — F4325 Adjustment disorder with mixed disturbance of emotions and conduct: Secondary | ICD-10-CM | POA: Diagnosis not present

## 2021-08-23 DIAGNOSIS — Z794 Long term (current) use of insulin: Secondary | ICD-10-CM | POA: Diagnosis not present

## 2021-08-26 DIAGNOSIS — F900 Attention-deficit hyperactivity disorder, predominantly inattentive type: Secondary | ICD-10-CM | POA: Diagnosis not present

## 2021-08-26 DIAGNOSIS — E113393 Type 2 diabetes mellitus with moderate nonproliferative diabetic retinopathy without macular edema, bilateral: Secondary | ICD-10-CM | POA: Diagnosis not present

## 2021-08-26 DIAGNOSIS — F4325 Adjustment disorder with mixed disturbance of emotions and conduct: Secondary | ICD-10-CM | POA: Diagnosis not present

## 2021-08-30 ENCOUNTER — Other Ambulatory Visit: Payer: Self-pay

## 2021-08-30 DIAGNOSIS — F4325 Adjustment disorder with mixed disturbance of emotions and conduct: Secondary | ICD-10-CM | POA: Diagnosis not present

## 2021-08-30 DIAGNOSIS — F341 Dysthymic disorder: Secondary | ICD-10-CM

## 2021-08-30 DIAGNOSIS — F411 Generalized anxiety disorder: Secondary | ICD-10-CM

## 2021-08-30 DIAGNOSIS — F900 Attention-deficit hyperactivity disorder, predominantly inattentive type: Secondary | ICD-10-CM | POA: Diagnosis not present

## 2021-08-30 MED ORDER — BUPROPION HCL ER (XL) 150 MG PO TB24: 150.0000 mg | ORAL_TABLET | Freq: Every morning | ORAL | 0 refills | Status: DC

## 2021-08-30 MED ORDER — VILAZODONE HCL 20 MG PO TABS: 20.0000 mg | ORAL_TABLET | Freq: Every day | ORAL | 0 refills | Status: DC

## 2021-09-08 DIAGNOSIS — E119 Type 2 diabetes mellitus without complications: Secondary | ICD-10-CM | POA: Diagnosis not present

## 2021-09-08 DIAGNOSIS — Z794 Long term (current) use of insulin: Secondary | ICD-10-CM | POA: Diagnosis not present

## 2021-09-08 DIAGNOSIS — E1165 Type 2 diabetes mellitus with hyperglycemia: Secondary | ICD-10-CM | POA: Diagnosis not present

## 2021-09-13 DIAGNOSIS — F900 Attention-deficit hyperactivity disorder, predominantly inattentive type: Secondary | ICD-10-CM | POA: Diagnosis not present

## 2021-09-13 DIAGNOSIS — F4325 Adjustment disorder with mixed disturbance of emotions and conduct: Secondary | ICD-10-CM | POA: Diagnosis not present

## 2021-09-19 ENCOUNTER — Other Ambulatory Visit: Payer: Self-pay | Admitting: Behavioral Health

## 2021-09-19 ENCOUNTER — Telehealth: Payer: Self-pay | Admitting: Behavioral Health

## 2021-09-19 DIAGNOSIS — F902 Attention-deficit hyperactivity disorder, combined type: Secondary | ICD-10-CM

## 2021-09-19 DIAGNOSIS — E1022 Type 1 diabetes mellitus with diabetic chronic kidney disease: Secondary | ICD-10-CM | POA: Diagnosis not present

## 2021-09-19 MED ORDER — METHYLPHENIDATE HCL ER (OSM) 27 MG PO TBCR: 27.0000 mg | EXTENDED_RELEASE_TABLET | ORAL | 0 refills | Status: DC

## 2021-09-19 NOTE — Telephone Encounter (Signed)
Patient called in for refill on Concerta 27mg . Ph: [phone] Appt 5/15. [facility] [address] [place], [place] ?

## 2021-09-19 NOTE — Telephone Encounter (Signed)
[facility] to [facility] [address] [facility]

## 2021-09-20 DIAGNOSIS — F4325 Adjustment disorder with mixed disturbance of emotions and conduct: Secondary | ICD-10-CM | POA: Diagnosis not present

## 2021-09-20 DIAGNOSIS — F900 Attention-deficit hyperactivity disorder, predominantly inattentive type: Secondary | ICD-10-CM | POA: Diagnosis not present

## 2021-09-27 DIAGNOSIS — F900 Attention-deficit hyperactivity disorder, predominantly inattentive type: Secondary | ICD-10-CM | POA: Diagnosis not present

## 2021-09-27 DIAGNOSIS — F4325 Adjustment disorder with mixed disturbance of emotions and conduct: Secondary | ICD-10-CM | POA: Diagnosis not present

## 2021-10-04 DIAGNOSIS — F4325 Adjustment disorder with mixed disturbance of emotions and conduct: Secondary | ICD-10-CM | POA: Diagnosis not present

## 2021-10-04 DIAGNOSIS — F900 Attention-deficit hyperactivity disorder, predominantly inattentive type: Secondary | ICD-10-CM | POA: Diagnosis not present

## 2021-10-06 DIAGNOSIS — F4325 Adjustment disorder with mixed disturbance of emotions and conduct: Secondary | ICD-10-CM | POA: Diagnosis not present

## 2021-10-11 DIAGNOSIS — E1165 Type 2 diabetes mellitus with hyperglycemia: Secondary | ICD-10-CM | POA: Diagnosis not present

## 2021-10-11 DIAGNOSIS — F4325 Adjustment disorder with mixed disturbance of emotions and conduct: Secondary | ICD-10-CM | POA: Diagnosis not present

## 2021-10-11 DIAGNOSIS — F900 Attention-deficit hyperactivity disorder, predominantly inattentive type: Secondary | ICD-10-CM | POA: Diagnosis not present

## 2021-10-17 ENCOUNTER — Ambulatory Visit (INDEPENDENT_AMBULATORY_CARE_PROVIDER_SITE_OTHER): Payer: BC Managed Care – PPO | Admitting: Behavioral Health

## 2021-10-17 ENCOUNTER — Encounter: Payer: Self-pay | Admitting: Behavioral Health

## 2021-10-17 DIAGNOSIS — F902 Attention-deficit hyperactivity disorder, combined type: Secondary | ICD-10-CM

## 2021-10-17 DIAGNOSIS — F341 Dysthymic disorder: Secondary | ICD-10-CM | POA: Diagnosis not present

## 2021-10-17 DIAGNOSIS — F411 Generalized anxiety disorder: Secondary | ICD-10-CM

## 2021-10-17 MED ORDER — METHYLPHENIDATE HCL ER (OSM) 27 MG PO TBCR: 27.0000 mg | EXTENDED_RELEASE_TABLET | ORAL | 0 refills | Status: DC

## 2021-10-17 MED ORDER — VILAZODONE HCL 20 MG PO TABS: 20.0000 mg | ORAL_TABLET | Freq: Every day | ORAL | 1 refills | Status: DC

## 2021-10-17 MED ORDER — BUPROPION HCL ER (XL) 150 MG PO TB24: 150.0000 mg | ORAL_TABLET | Freq: Every morning | ORAL | 1 refills | Status: DC

## 2021-10-17 NOTE — Progress Notes (Signed)
[facility] ? ?Patient ID: Benjamin [name],  ?MRN: [id number] ? ?PCP: Creola Corn, MD ? ?Date of Evaluation: 10/17/2021 ?Time spent:20 minutes ? ?Chief Complaint:  ?Chief Complaint   ?Anxiety; Depression; Follow-up; Medication Refill ?  ? ? ?HISTORY/CURRENT STATUS: ?HPI ? ?Benjamin [name], 46 year old male  is seen onsite in office face-to-face for follow up and medication management. Says that he has been doing well with the Viibryd. Feels like his anxiety and depression are at low manageable levels.  Says his anxiety today is 2/10 and depression is 2/10. He is sleeping 7-8 hours per night.  He has strong family support and network of resources. Feel he is in a good place and feeling stable at this time.  He denies mania, no psychosis, no SI/HI. Per pt. Would like 3 month follow up.  ?  ?Prior psychiatric medications: ?Paxil  ? ?Individual Medical History/ Review of Systems: Changes? :No  ? ?Allergies: Bee venom and Lisinopril ? ?Current Medications:  ?Current Outpatient Medications:  ?  atorvastatin (LIPITOR) 80 MG tablet, Take 80 mg by mouth daily at 6 PM. , Disp: , Rfl:  ?  buPROPion (WELLBUTRIN XL) 150 MG 24 hr tablet, Take 1 tablet (150 mg total) by mouth every morning., Disp: 90 tablet, Rfl: 1 ?  Continuous Blood Gluc Sensor (FREESTYLE LIBRE 14 DAY SENSOR) MISC, , Disp: , Rfl:  ?  HUMALOG 100 UNIT/ML injection, Inject into the skin. Insulin pump., Disp: , Rfl:  ?  losartan (COZAAR) 25 MG tablet, Take 25 mg by mouth daily., Disp: , Rfl:  ?  methylphenidate (CONCERTA) 27 MG PO CR tablet, Take 1 tablet (27 mg total) by mouth every morning., Disp: 30 tablet, Rfl: 0 ?  methylphenidate (CONCERTA) 27 MG PO CR tablet, Take 1 tablet (27 mg total) by mouth every morning., Disp: 30 tablet, Rfl: 0 ?  [START ON 10/22/2021] methylphenidate (CONCERTA) 27 MG PO CR tablet, Take 1 tablet (27 mg total) by mouth every morning., Disp: 30 tablet, Rfl: 0 ?  sildenafil (VIAGRA) 50 MG tablet, Take 50 mg by mouth daily as needed for  erectile dysfunction., Disp: , Rfl:  ?  Vilazodone HCl 20 MG TABS, Take 1 tablet (20 mg total) by mouth daily., Disp: 90 tablet, Rfl: 1 ?Medication Side Effects: none ? ?Family Medical/ Social History: Changes? No ? ?MENTAL HEALTH EXAM: ? ?There were no vitals taken for this visit.There is no height or weight on file to calculate BMI.  ?General Appearance: Casual, Neat, and Well Groomed  ?Eye Contact:  Good  ?Speech:  Clear and Coherent  ?Volume:  Normal  ?Mood:  NA  ?Affect:  Appropriate  ?Thought Process:  Coherent  ?Orientation:  Full (Time, Place, and Person)  ?Thought Content: Logical   ?Suicidal Thoughts:  No  ?Homicidal Thoughts:  No  ?Memory:  WNL  ?Judgement:  Good  ?Insight:  Good  ?Psychomotor Activity:  Normal  ?Concentration:  Concentration: Good  ?Recall:  Good  ?Fund of Knowledge: Good  ?Language: Good  ?Assets:  Desire for Improvement  ?ADL's:  Intact  ?Cognition: WNL  ?Prognosis:  Good  ? ? ?DIAGNOSES:  ?  ICD-10-CM   ?1. Generalized anxiety disorder  F41.1 buPROPion (WELLBUTRIN XL) 150 MG 24 hr tablet  ?  Vilazodone HCl 20 MG TABS  ?  ?2. Persistent depressive disorder with atypical features, currently moderate  F34.1 buPROPion (WELLBUTRIN XL) 150 MG 24 hr tablet  ?  Vilazodone HCl 20 MG TABS  ?  ?3. Attention  deficit hyperactivity disorder (ADHD), combined type  F90.2 methylphenidate (CONCERTA) 27 MG PO CR tablet  ?  ? ? ?Receiving Psychotherapy: Yes [name] ? ? ?RECOMMENDATIONS:  ? ?Greater than 50% of  20  min face to face time with patient was spent on counseling and coordination of care. We is discussed his current level of stability. He feels like he is in a good place right now. No medication changes indicated. He is currently receiving therapy and counseling with [name]. Discussed his previous tx plan and new goals for tx in reestablishing care.  ?We agreed for him to continue Concerta, 27 mg once daily. ?Will continue Wellbutrin 150 mg XL daily. ?To continue Viibryd 20  mg tablet  daily. ?Will report side effects or worsening symptoms promptly ?To follow up in 4 monts to reassess. ?Discussed potential benefits, risks, and side effects of stimulants with patient to include increased heart rate, palpitations, insomnia, increased anxiety, increased irritability, or decreased appetite.  Instructed patient to contact office if experiencing any significant tolerability issues.  ?Reviewed PDMP ?  ?  ? ? ? ? ?Joan Flores, NP  ?

## 2021-10-18 DIAGNOSIS — F4325 Adjustment disorder with mixed disturbance of emotions and conduct: Secondary | ICD-10-CM | POA: Diagnosis not present

## 2021-10-18 DIAGNOSIS — F900 Attention-deficit hyperactivity disorder, predominantly inattentive type: Secondary | ICD-10-CM | POA: Diagnosis not present

## 2021-10-25 DIAGNOSIS — F900 Attention-deficit hyperactivity disorder, predominantly inattentive type: Secondary | ICD-10-CM | POA: Diagnosis not present

## 2021-10-25 DIAGNOSIS — F4325 Adjustment disorder with mixed disturbance of emotions and conduct: Secondary | ICD-10-CM | POA: Diagnosis not present

## 2021-11-01 DIAGNOSIS — F4325 Adjustment disorder with mixed disturbance of emotions and conduct: Secondary | ICD-10-CM | POA: Diagnosis not present

## 2021-11-01 DIAGNOSIS — F900 Attention-deficit hyperactivity disorder, predominantly inattentive type: Secondary | ICD-10-CM | POA: Diagnosis not present

## 2021-11-08 DIAGNOSIS — F900 Attention-deficit hyperactivity disorder, predominantly inattentive type: Secondary | ICD-10-CM | POA: Diagnosis not present

## 2021-11-08 DIAGNOSIS — F4325 Adjustment disorder with mixed disturbance of emotions and conduct: Secondary | ICD-10-CM | POA: Diagnosis not present

## 2021-11-11 DIAGNOSIS — E119 Type 2 diabetes mellitus without complications: Secondary | ICD-10-CM | POA: Diagnosis not present

## 2021-11-11 DIAGNOSIS — Z794 Long term (current) use of insulin: Secondary | ICD-10-CM | POA: Diagnosis not present

## 2021-11-11 DIAGNOSIS — E1165 Type 2 diabetes mellitus with hyperglycemia: Secondary | ICD-10-CM | POA: Diagnosis not present

## 2021-11-15 DIAGNOSIS — F4325 Adjustment disorder with mixed disturbance of emotions and conduct: Secondary | ICD-10-CM | POA: Diagnosis not present

## 2021-11-15 DIAGNOSIS — F900 Attention-deficit hyperactivity disorder, predominantly inattentive type: Secondary | ICD-10-CM | POA: Diagnosis not present

## 2021-11-17 DIAGNOSIS — E119 Type 2 diabetes mellitus without complications: Secondary | ICD-10-CM | POA: Diagnosis not present

## 2021-11-17 DIAGNOSIS — Z794 Long term (current) use of insulin: Secondary | ICD-10-CM | POA: Diagnosis not present

## 2021-11-17 DIAGNOSIS — E1165 Type 2 diabetes mellitus with hyperglycemia: Secondary | ICD-10-CM | POA: Diagnosis not present

## 2021-11-22 DIAGNOSIS — E1022 Type 1 diabetes mellitus with diabetic chronic kidney disease: Secondary | ICD-10-CM | POA: Diagnosis not present

## 2021-11-22 DIAGNOSIS — I10 Essential (primary) hypertension: Secondary | ICD-10-CM | POA: Diagnosis not present

## 2021-11-29 DIAGNOSIS — F900 Attention-deficit hyperactivity disorder, predominantly inattentive type: Secondary | ICD-10-CM | POA: Diagnosis not present

## 2021-11-29 DIAGNOSIS — F4325 Adjustment disorder with mixed disturbance of emotions and conduct: Secondary | ICD-10-CM | POA: Diagnosis not present

## 2021-12-01 DIAGNOSIS — Z794 Long term (current) use of insulin: Secondary | ICD-10-CM | POA: Diagnosis not present

## 2021-12-01 DIAGNOSIS — E119 Type 2 diabetes mellitus without complications: Secondary | ICD-10-CM | POA: Diagnosis not present

## 2021-12-01 DIAGNOSIS — E1165 Type 2 diabetes mellitus with hyperglycemia: Secondary | ICD-10-CM | POA: Diagnosis not present

## 2021-12-09 DIAGNOSIS — F4325 Adjustment disorder with mixed disturbance of emotions and conduct: Secondary | ICD-10-CM | POA: Diagnosis not present

## 2021-12-09 DIAGNOSIS — F900 Attention-deficit hyperactivity disorder, predominantly inattentive type: Secondary | ICD-10-CM | POA: Diagnosis not present

## 2021-12-13 DIAGNOSIS — F4325 Adjustment disorder with mixed disturbance of emotions and conduct: Secondary | ICD-10-CM | POA: Diagnosis not present

## 2021-12-13 DIAGNOSIS — F900 Attention-deficit hyperactivity disorder, predominantly inattentive type: Secondary | ICD-10-CM | POA: Diagnosis not present

## 2021-12-20 DIAGNOSIS — F4325 Adjustment disorder with mixed disturbance of emotions and conduct: Secondary | ICD-10-CM | POA: Diagnosis not present

## 2021-12-20 DIAGNOSIS — F900 Attention-deficit hyperactivity disorder, predominantly inattentive type: Secondary | ICD-10-CM | POA: Diagnosis not present

## 2021-12-21 ENCOUNTER — Other Ambulatory Visit: Payer: Self-pay

## 2021-12-21 ENCOUNTER — Telehealth: Payer: Self-pay | Admitting: Behavioral Health

## 2021-12-21 DIAGNOSIS — F902 Attention-deficit hyperactivity disorder, combined type: Secondary | ICD-10-CM

## 2021-12-21 NOTE — Telephone Encounter (Signed)
Pended.

## 2021-12-21 NOTE — Telephone Encounter (Signed)
Pt lvm that he needs a [facility]ill [facility]

## 2021-12-22 MED ORDER — METHYLPHENIDATE HCL ER (OSM) 27 MG PO TBCR: 27.0000 mg | EXTENDED_RELEASE_TABLET | ORAL | 0 refills | Status: DC

## 2022-01-10 DIAGNOSIS — F4325 Adjustment disorder with mixed disturbance of emotions and conduct: Secondary | ICD-10-CM | POA: Diagnosis not present

## 2022-01-10 DIAGNOSIS — E1165 Type 2 diabetes mellitus with hyperglycemia: Secondary | ICD-10-CM | POA: Diagnosis not present

## 2022-01-17 DIAGNOSIS — F4325 Adjustment disorder with mixed disturbance of emotions and conduct: Secondary | ICD-10-CM | POA: Diagnosis not present

## 2022-01-24 DIAGNOSIS — F4325 Adjustment disorder with mixed disturbance of emotions and conduct: Secondary | ICD-10-CM | POA: Diagnosis not present

## 2022-01-25 DIAGNOSIS — E119 Type 2 diabetes mellitus without complications: Secondary | ICD-10-CM | POA: Diagnosis not present

## 2022-01-25 DIAGNOSIS — E1165 Type 2 diabetes mellitus with hyperglycemia: Secondary | ICD-10-CM | POA: Diagnosis not present

## 2022-01-25 DIAGNOSIS — Z794 Long term (current) use of insulin: Secondary | ICD-10-CM | POA: Diagnosis not present

## 2022-01-31 DIAGNOSIS — F4325 Adjustment disorder with mixed disturbance of emotions and conduct: Secondary | ICD-10-CM | POA: Diagnosis not present

## 2022-01-31 DIAGNOSIS — E1022 Type 1 diabetes mellitus with diabetic chronic kidney disease: Secondary | ICD-10-CM | POA: Diagnosis not present

## 2022-02-07 DIAGNOSIS — F4325 Adjustment disorder with mixed disturbance of emotions and conduct: Secondary | ICD-10-CM | POA: Diagnosis not present

## 2022-02-13 DIAGNOSIS — E119 Type 2 diabetes mellitus without complications: Secondary | ICD-10-CM | POA: Diagnosis not present

## 2022-02-13 DIAGNOSIS — Z794 Long term (current) use of insulin: Secondary | ICD-10-CM | POA: Diagnosis not present

## 2022-02-13 DIAGNOSIS — E1165 Type 2 diabetes mellitus with hyperglycemia: Secondary | ICD-10-CM | POA: Diagnosis not present

## 2022-02-14 DIAGNOSIS — F4325 Adjustment disorder with mixed disturbance of emotions and conduct: Secondary | ICD-10-CM | POA: Diagnosis not present

## 2022-02-16 ENCOUNTER — Encounter: Payer: Self-pay | Admitting: Behavioral Health

## 2022-02-16 ENCOUNTER — Ambulatory Visit (INDEPENDENT_AMBULATORY_CARE_PROVIDER_SITE_OTHER): Payer: BC Managed Care – PPO | Admitting: Behavioral Health

## 2022-02-16 DIAGNOSIS — F902 Attention-deficit hyperactivity disorder, combined type: Secondary | ICD-10-CM | POA: Diagnosis not present

## 2022-02-16 DIAGNOSIS — F411 Generalized anxiety disorder: Secondary | ICD-10-CM

## 2022-02-16 DIAGNOSIS — F341 Dysthymic disorder: Secondary | ICD-10-CM | POA: Diagnosis not present

## 2022-02-16 MED ORDER — BUPROPION HCL ER (XL) 150 MG PO TB24: 150.0000 mg | ORAL_TABLET | Freq: Every morning | ORAL | 1 refills | Status: DC

## 2022-02-16 MED ORDER — VILAZODONE HCL 20 MG PO TABS: 20.0000 mg | ORAL_TABLET | Freq: Every day | ORAL | 1 refills | Status: DC

## 2022-02-16 MED ORDER — METHYLPHENIDATE HCL ER (OSM) 27 MG PO TBCR: 27.0000 mg | EXTENDED_RELEASE_TABLET | ORAL | 0 refills | Status: DC

## 2022-02-16 NOTE — Progress Notes (Signed)
[facility]  Patient ID: Benjamin [name],  MRN: [id number]  PCP: Creola Corn, MD  Date of Evaluation: 02/16/2022 Time spent:30 minutes  Chief Complaint:  Chief Complaint   Anxiety; Depression; Follow-up; Medication Refill; Medication Problem     HISTORY/CURRENT STATUS: HPI  56, 46 year old male  is seen onsite in office face-to-face for follow up and medication management. Says that he has been doing well with the Viibryd. Feels like his anxiety and depression are at low manageable levels. His daughter started college and son high school. Says his home life is very good.  Says his anxiety today is 2/10 and depression is 2/10. He is sleeping 7-8 hours per night.  He has strong family support and network of resources. Feel he is in a good place and feeling stable at this time.  He denies mania, no psychosis, no SI/HI. Per pt. Would like 3 month follow up.    Prior psychiatric medications: Paxil     Individual Medical History/ Review of Systems: Changes? :No   Allergies: Bee venom and Lisinopril  Current Medications:  Current Outpatient Medications:    atorvastatin (LIPITOR) 80 MG tablet, Take 80 mg by mouth daily at 6 PM. , Disp: , Rfl:    buPROPion (WELLBUTRIN XL) 150 MG 24 hr tablet, Take 1 tablet (150 mg total) by mouth every morning., Disp: 90 tablet, Rfl: 1   Continuous Blood Gluc Sensor (FREESTYLE LIBRE 14 DAY SENSOR) MISC, , Disp: , Rfl:    HUMALOG 100 UNIT/ML injection, Inject into the skin. Insulin pump., Disp: , Rfl:    losartan (COZAAR) 25 MG tablet, Take 25 mg by mouth daily., Disp: , Rfl:    methylphenidate (CONCERTA) 27 MG PO CR tablet, Take 1 tablet (27 mg total) by mouth every morning., Disp: 30 tablet, Rfl: 0   methylphenidate (CONCERTA) 27 MG PO CR tablet, Take 1 tablet (27 mg total) by mouth every morning., Disp: 30 tablet, Rfl: 0   methylphenidate (CONCERTA) 27 MG PO CR tablet, Take 1 tablet (27 mg total) by mouth every morning., Disp: 30 tablet,  Rfl: 0   sildenafil (VIAGRA) 50 MG tablet, Take 50 mg by mouth daily as needed for erectile dysfunction., Disp: , Rfl:    Vilazodone HCl 20 MG TABS, Take 1 tablet (20 mg total) by mouth daily., Disp: 90 tablet, Rfl: 1 Medication Side Effects: none  Family Medical/ Social History: Changes? No  MENTAL HEALTH EXAM:  There were no vitals taken for this visit.There is no height or weight on file to calculate BMI.  General Appearance: Casual, Neat, and Well Groomed  Eye Contact:  Good  Speech:  Clear and Coherent  Volume:  Normal  Mood:  Angry  Affect:  Appropriate  Thought Process:  Coherent  Orientation:  Full (Time, Place, and Person)  Thought Content: Logical   Suicidal Thoughts:  No  Homicidal Thoughts:  No  Memory:  WNL  Judgement:  Good  Insight:  Good  Psychomotor Activity:  Normal  Concentration:  Concentration: Good  Recall:  Good  Fund of Knowledge: Good  Language: Good  Assets:  Desire for Improvement  ADL's:  Intact  Cognition: WNL  Prognosis:  Good    DIAGNOSES:    ICD-10-CM   1. Generalized anxiety disorder  F41.1 buPROPion (WELLBUTRIN XL) 150 MG 24 hr tablet    Vilazodone HCl 20 MG TABS    2. Persistent depressive disorder with atypical features, currently moderate  F34.1 buPROPion (WELLBUTRIN XL) 150 MG  24 hr tablet    Vilazodone HCl 20 MG TABS    3. Attention deficit hyperactivity disorder (ADHD), combined type  F90.2 methylphenidate (CONCERTA) 27 MG PO CR tablet      Receiving Psychotherapy: No    RECOMMENDATIONS:    Greater than 50% of  20  min face to face time with patient was spent on counseling and coordination of care. We is discussed his current level of stability. He feels like he is in a good place right now. No medication changes indicated. He is currently receiving therapy and counseling with [name].  We agreed for him to continue Concerta, 27 mg once daily. Will continue Wellbutrin 150 mg XL daily. To continue Viibryd 20  mg tablet  daily. Will report side effects or worsening symptoms promptly To follow up in 4 monts to reassess. Discussed potential benefits, risks, and side effects of stimulants with patient to include increased heart rate, palpitations, insomnia, increased anxiety, increased irritability, or decreased appetite.  Instructed patient to contact office if experiencing any significant tolerability issues.  Reviewed PDMP      Joan Flores, NP

## 2022-02-21 DIAGNOSIS — E119 Type 2 diabetes mellitus without complications: Secondary | ICD-10-CM | POA: Diagnosis not present

## 2022-02-21 DIAGNOSIS — E1165 Type 2 diabetes mellitus with hyperglycemia: Secondary | ICD-10-CM | POA: Diagnosis not present

## 2022-02-21 DIAGNOSIS — Z794 Long term (current) use of insulin: Secondary | ICD-10-CM | POA: Diagnosis not present

## 2022-02-28 DIAGNOSIS — F4325 Adjustment disorder with mixed disturbance of emotions and conduct: Secondary | ICD-10-CM | POA: Diagnosis not present

## 2022-03-07 DIAGNOSIS — F4325 Adjustment disorder with mixed disturbance of emotions and conduct: Secondary | ICD-10-CM | POA: Diagnosis not present

## 2022-03-08 ENCOUNTER — Other Ambulatory Visit: Payer: Self-pay

## 2022-03-08 ENCOUNTER — Telehealth: Payer: Self-pay | Admitting: Behavioral Health

## 2022-03-08 NOTE — Telephone Encounter (Signed)
Patient has scripts on file with [facility], but the medication is on backorder and scripts were deactivated.  Patient said they told him they are expecting a shipment on 10/7 and he would still like script sent to [facility].

## 2022-03-08 NOTE — Telephone Encounter (Signed)
Patient lvm requesting a refill on the Ritalin. Patient was last seen 9/14, with no follow up scheduled.  Fill at [place], [place]  [address] [place] [address]  Phone:  [phone]  Fax:  [phone]

## 2022-03-10 NOTE — Telephone Encounter (Signed)
[facility] had scripts that they deactivated due to not having availability of medication. They do have brand but insurance will not cover. Shipment expected tomorrow. They have reactivated script but will need to call to see if available. Patient notified.

## 2022-03-14 DIAGNOSIS — F4325 Adjustment disorder with mixed disturbance of emotions and conduct: Secondary | ICD-10-CM | POA: Diagnosis not present

## 2022-03-21 ENCOUNTER — Other Ambulatory Visit (HOSPITAL_BASED_OUTPATIENT_CLINIC_OR_DEPARTMENT_OTHER): Payer: Self-pay

## 2022-03-21 ENCOUNTER — Other Ambulatory Visit: Payer: Self-pay

## 2022-03-21 ENCOUNTER — Telehealth: Payer: Self-pay | Admitting: Behavioral Health

## 2022-03-21 DIAGNOSIS — F4325 Adjustment disorder with mixed disturbance of emotions and conduct: Secondary | ICD-10-CM | POA: Diagnosis not present

## 2022-03-21 DIAGNOSIS — F902 Attention-deficit hyperactivity disorder, combined type: Secondary | ICD-10-CM

## 2022-03-21 MED ORDER — METHYLPHENIDATE HCL ER 27 MG PO TB24
27.0000 mg | ORAL_TABLET | ORAL | 0 refills | Status: DC
  Filled 2022-03-21: qty 30, 30d supply, fill #0

## 2022-03-21 NOTE — Telephone Encounter (Signed)
Pended.

## 2022-03-21 NOTE — Telephone Encounter (Signed)
[name] called this morning at 10:05 to request that his prescription for methylphenidate be sent to [place].  He said they have it. He has had a hard time finding it.

## 2022-03-28 DIAGNOSIS — F4325 Adjustment disorder with mixed disturbance of emotions and conduct: Secondary | ICD-10-CM | POA: Diagnosis not present

## 2022-04-04 DIAGNOSIS — F4325 Adjustment disorder with mixed disturbance of emotions and conduct: Secondary | ICD-10-CM | POA: Diagnosis not present

## 2022-04-04 DIAGNOSIS — I10 Essential (primary) hypertension: Secondary | ICD-10-CM | POA: Diagnosis not present

## 2022-04-04 DIAGNOSIS — E1022 Type 1 diabetes mellitus with diabetic chronic kidney disease: Secondary | ICD-10-CM | POA: Diagnosis not present

## 2022-04-04 DIAGNOSIS — Z23 Encounter for immunization: Secondary | ICD-10-CM | POA: Diagnosis not present

## 2022-04-06 DIAGNOSIS — F4325 Adjustment disorder with mixed disturbance of emotions and conduct: Secondary | ICD-10-CM | POA: Diagnosis not present

## 2022-04-11 DIAGNOSIS — E1165 Type 2 diabetes mellitus with hyperglycemia: Secondary | ICD-10-CM | POA: Diagnosis not present

## 2022-04-14 DIAGNOSIS — F4325 Adjustment disorder with mixed disturbance of emotions and conduct: Secondary | ICD-10-CM | POA: Diagnosis not present

## 2022-04-18 DIAGNOSIS — Z794 Long term (current) use of insulin: Secondary | ICD-10-CM | POA: Diagnosis not present

## 2022-04-18 DIAGNOSIS — E119 Type 2 diabetes mellitus without complications: Secondary | ICD-10-CM | POA: Diagnosis not present

## 2022-04-18 DIAGNOSIS — E1165 Type 2 diabetes mellitus with hyperglycemia: Secondary | ICD-10-CM | POA: Diagnosis not present

## 2022-04-19 ENCOUNTER — Telehealth: Payer: Self-pay | Admitting: Behavioral Health

## 2022-04-19 NOTE — Telephone Encounter (Signed)
Pt stated he had been on this med for a while and the diarrhea has been going on since he started.He can't tolerate it and wants to stop med.I asked him did he see any benefit and he said he is not sure because he is on another anti depressant as well.

## 2022-04-19 NOTE — Telephone Encounter (Signed)
Pt informed

## 2022-04-19 NOTE — Telephone Encounter (Signed)
Pt called and said that he is experiencing severe diarrhea from the vilazodone 20 mg tabs. Please call him at [phone] to tell him how to stop medication

## 2022-04-20 DIAGNOSIS — F4325 Adjustment disorder with mixed disturbance of emotions and conduct: Secondary | ICD-10-CM | POA: Diagnosis not present

## 2022-04-26 DIAGNOSIS — F4325 Adjustment disorder with mixed disturbance of emotions and conduct: Secondary | ICD-10-CM | POA: Diagnosis not present

## 2022-05-03 DIAGNOSIS — F4325 Adjustment disorder with mixed disturbance of emotions and conduct: Secondary | ICD-10-CM | POA: Diagnosis not present

## 2022-05-09 ENCOUNTER — Other Ambulatory Visit: Payer: Self-pay

## 2022-05-09 ENCOUNTER — Telehealth: Payer: Self-pay | Admitting: Behavioral Health

## 2022-05-09 DIAGNOSIS — E1165 Type 2 diabetes mellitus with hyperglycemia: Secondary | ICD-10-CM | POA: Diagnosis not present

## 2022-05-09 DIAGNOSIS — Z794 Long term (current) use of insulin: Secondary | ICD-10-CM | POA: Diagnosis not present

## 2022-05-09 DIAGNOSIS — E119 Type 2 diabetes mellitus without complications: Secondary | ICD-10-CM | POA: Diagnosis not present

## 2022-05-09 DIAGNOSIS — F902 Attention-deficit hyperactivity disorder, combined type: Secondary | ICD-10-CM

## 2022-05-09 MED ORDER — METHYLPHENIDATE HCL ER (OSM) 27 MG PO TBCR: 27.0000 mg | EXTENDED_RELEASE_TABLET | ORAL | 0 refills | Status: DC

## 2022-05-09 NOTE — Telephone Encounter (Signed)
Pended.

## 2022-05-09 NOTE — Telephone Encounter (Signed)
Witt called todat at 10:48 to request refill of his Concerta.  Appt 12/20.  Send to [facility].

## 2022-05-10 DIAGNOSIS — F4325 Adjustment disorder with mixed disturbance of emotions and conduct: Secondary | ICD-10-CM | POA: Diagnosis not present

## 2022-05-12 DIAGNOSIS — Z794 Long term (current) use of insulin: Secondary | ICD-10-CM | POA: Diagnosis not present

## 2022-05-12 DIAGNOSIS — E1165 Type 2 diabetes mellitus with hyperglycemia: Secondary | ICD-10-CM | POA: Diagnosis not present

## 2022-05-12 DIAGNOSIS — E119 Type 2 diabetes mellitus without complications: Secondary | ICD-10-CM | POA: Diagnosis not present

## 2022-05-15 ENCOUNTER — Other Ambulatory Visit: Payer: Self-pay

## 2022-05-15 ENCOUNTER — Telehealth: Payer: Self-pay | Admitting: Behavioral Health

## 2022-05-15 DIAGNOSIS — F902 Attention-deficit hyperactivity disorder, combined type: Secondary | ICD-10-CM

## 2022-05-15 NOTE — Telephone Encounter (Signed)
Pt lvm that  he needs a paper script of concerta 27 mg. He is not able to find it anywhere and Rylen told him he could give him a paper script. Please call him at [phone]

## 2022-05-15 NOTE — Telephone Encounter (Signed)
Pended.

## 2022-05-16 ENCOUNTER — Other Ambulatory Visit: Payer: Self-pay

## 2022-05-16 ENCOUNTER — Other Ambulatory Visit (HOSPITAL_BASED_OUTPATIENT_CLINIC_OR_DEPARTMENT_OTHER): Payer: Self-pay

## 2022-05-16 ENCOUNTER — Other Ambulatory Visit: Payer: Self-pay | Admitting: Behavioral Health

## 2022-05-16 DIAGNOSIS — F902 Attention-deficit hyperactivity disorder, combined type: Secondary | ICD-10-CM

## 2022-05-16 MED ORDER — METHYLPHENIDATE HCL ER (OSM) 27 MG PO TBCR: 27.0000 mg | EXTENDED_RELEASE_TABLET | ORAL | 0 refills | Status: DC

## 2022-05-17 ENCOUNTER — Other Ambulatory Visit: Payer: Self-pay

## 2022-05-17 ENCOUNTER — Other Ambulatory Visit (HOSPITAL_BASED_OUTPATIENT_CLINIC_OR_DEPARTMENT_OTHER): Payer: Self-pay

## 2022-05-17 ENCOUNTER — Telehealth: Payer: Self-pay

## 2022-05-17 DIAGNOSIS — F902 Attention-deficit hyperactivity disorder, combined type: Secondary | ICD-10-CM

## 2022-05-17 MED ORDER — METHYLPHENIDATE HCL ER 27 MG PO TB24
27.0000 mg | ORAL_TABLET | ORAL | 0 refills | Status: DC
  Filled 2022-05-17: qty 30, 30d supply, fill #0

## 2022-05-17 NOTE — Telephone Encounter (Signed)
PT called and wants [place] to send the Concerta 27mg  rx to [facility] at [facility]. Please send asap

## 2022-05-17 NOTE — Telephone Encounter (Signed)
Pended.

## 2022-05-18 ENCOUNTER — Other Ambulatory Visit (HOSPITAL_BASED_OUTPATIENT_CLINIC_OR_DEPARTMENT_OTHER): Payer: Self-pay

## 2022-05-19 DIAGNOSIS — Z125 Encounter for screening for malignant neoplasm of prostate: Secondary | ICD-10-CM | POA: Diagnosis not present

## 2022-05-19 DIAGNOSIS — E1022 Type 1 diabetes mellitus with diabetic chronic kidney disease: Secondary | ICD-10-CM | POA: Diagnosis not present

## 2022-05-19 DIAGNOSIS — E785 Hyperlipidemia, unspecified: Secondary | ICD-10-CM | POA: Diagnosis not present

## 2022-05-22 DIAGNOSIS — Z1331 Encounter for screening for depression: Secondary | ICD-10-CM | POA: Diagnosis not present

## 2022-05-22 DIAGNOSIS — Z1389 Encounter for screening for other disorder: Secondary | ICD-10-CM | POA: Diagnosis not present

## 2022-05-22 DIAGNOSIS — E1022 Type 1 diabetes mellitus with diabetic chronic kidney disease: Secondary | ICD-10-CM | POA: Diagnosis not present

## 2022-05-22 DIAGNOSIS — I10 Essential (primary) hypertension: Secondary | ICD-10-CM | POA: Diagnosis not present

## 2022-05-22 DIAGNOSIS — Z0000 Encounter for general adult medical examination without abnormal findings: Secondary | ICD-10-CM | POA: Diagnosis not present

## 2022-05-22 DIAGNOSIS — R82998 Other abnormal findings in urine: Secondary | ICD-10-CM | POA: Diagnosis not present

## 2022-05-24 ENCOUNTER — Encounter: Payer: Self-pay | Admitting: Behavioral Health

## 2022-05-24 ENCOUNTER — Other Ambulatory Visit: Payer: Self-pay

## 2022-05-24 ENCOUNTER — Other Ambulatory Visit (HOSPITAL_BASED_OUTPATIENT_CLINIC_OR_DEPARTMENT_OTHER): Payer: Self-pay

## 2022-05-24 ENCOUNTER — Ambulatory Visit (INDEPENDENT_AMBULATORY_CARE_PROVIDER_SITE_OTHER): Payer: BC Managed Care – PPO | Admitting: Behavioral Health

## 2022-05-24 DIAGNOSIS — F4325 Adjustment disorder with mixed disturbance of emotions and conduct: Secondary | ICD-10-CM | POA: Diagnosis not present

## 2022-05-24 DIAGNOSIS — F902 Attention-deficit hyperactivity disorder, combined type: Secondary | ICD-10-CM

## 2022-05-24 DIAGNOSIS — F411 Generalized anxiety disorder: Secondary | ICD-10-CM | POA: Diagnosis not present

## 2022-05-24 DIAGNOSIS — F341 Dysthymic disorder: Secondary | ICD-10-CM | POA: Diagnosis not present

## 2022-05-24 MED ORDER — ESCITALOPRAM OXALATE 10 MG PO TABS
10.0000 mg | ORAL_TABLET | Freq: Every day | ORAL | 1 refills | Status: DC
  Filled 2022-05-24 (×2): qty 30, 30d supply, fill #0

## 2022-05-24 NOTE — Progress Notes (Signed)
[facility]  Patient ID: Benjamin [name],  MRN: [id number]  PCP: Benjamin Corn, MD  Date of Evaluation: 05/24/2022 Time spent:30 minutes  Chief Complaint:  Chief Complaint   Anxiety; Depression; ADHD; Medication Refill; Patient Education; Medication Problem     HISTORY/CURRENT STATUS: HPI 60, 46 year old male  is seen onsite in office face-to-face for follow up and medication management. Says that Benjamin [name] was working well for him but finally just had to stop because he was continuing to have diarrhea about 5 times per day. He is wanting to try another medication. He continues to have consistent problems with irritability.  Says his anxiety today is 5/10 and depression is 6/10. He is sleeping 7-8 hours per night.  He has strong family support and network of resources. Feel he is in a good place and feeling stable at this time.  He denies mania, no psychosis, no SI/HI. F/U in 4 weeks to reassess.   Prior psychiatric medications: Paxil     Individual Medical History/ Review of Systems: Changes? :No   Allergies: Bee venom and Lisinopril  Current Medications:  Current Outpatient Medications:    escitalopram (LEXAPRO) 10 MG tablet, Take 1 tablet (10 mg total) by mouth daily., Disp: 30 tablet, Rfl: 1   atorvastatin (LIPITOR) 80 MG tablet, Take 80 mg by mouth daily at 6 PM. , Disp: , Rfl:    buPROPion (WELLBUTRIN XL) 150 MG 24 hr tablet, Take 1 tablet (150 mg total) by mouth every morning., Disp: 90 tablet, Rfl: 1   Continuous Blood Gluc Sensor (FREESTYLE LIBRE 14 DAY SENSOR) MISC, , Disp: , Rfl:    HUMALOG 100 UNIT/ML injection, Inject into the skin. Insulin pump., Disp: , Rfl:    losartan (COZAAR) 25 MG tablet, Take 25 mg by mouth daily., Disp: , Rfl:    methylphenidate 27 MG PO TB24, Take 1 tablet (27 mg total) by mouth every morning., Disp: 30 tablet, Rfl: 0   methylphenidate 27 MG PO TB24, Take 1 tablet (27 mg total) by mouth every morning., Disp: 30 tablet, Rfl: 0    sildenafil (VIAGRA) 50 MG tablet, Take 50 mg by mouth daily as needed for erectile dysfunction., Disp: , Rfl:  Medication Side Effects: none  Family Medical/ Social History: Changes? No  MENTAL HEALTH EXAM:  There were no vitals taken for this visit.There is no height or weight on file to calculate BMI.  General Appearance: Casual, Neat, and Well Groomed  Eye Contact:  Good  Speech:  Clear and Coherent  Volume:  Normal  Mood:  Depressed and Irritable  Affect:  Congruent, Depressed, Flat, and Anxious  Thought Process:  Coherent  Orientation:  Full (Time, Place, and Person)  Thought Content: Logical   Suicidal Thoughts:  No  Homicidal Thoughts:  No  Memory:  WNL  Judgement:  Good  Insight:  Good  Psychomotor Activity:  Normal  Concentration:  Concentration: Good  Recall:  Good  Fund of Knowledge: Good  Language: Good  Assets:  Desire for Improvement  ADL's:  Intact  Cognition: WNL  Prognosis:  Good    DIAGNOSES:    ICD-10-CM   1. Persistent depressive disorder with atypical features, currently moderate  F34.1 escitalopram (LEXAPRO) 10 MG tablet    2. Generalized anxiety disorder  F41.1 escitalopram (LEXAPRO) 10 MG tablet    3. Attention deficit hyperactivity disorder (ADHD), combined type  F90.2       Receiving Psychotherapy: No    RECOMMENDATIONS:   Greater than  50% of 30 min face to face time with patient was spent on counseling and coordination of care. We is discussed his current level of stability. He had to stop Viibryd due to constant diarrhea.  Still reporting consistent irritability. He is currently receiving therapy and counseling with Benjamin [name].  We agreed for him to continue Concerta, 27 mg once daily. Will continue Wellbutrin 150 mg XL daily. Stopped Viibryd 20  mg tablet daily. To start Lexapro 10 mg daily after breakfast  Will report side effects or worsening symptoms promptly To follow up in 4 weeks to reassess. Discussed potential benefits,  risks, and side effects of stimulants with patient to include increased heart rate, palpitations, insomnia, increased anxiety, increased irritability, or decreased appetite.  Instructed patient to contact office if experiencing any significant tolerability issues.  Reviewed PDMP    Benjamin Flores, NP

## 2022-06-07 DIAGNOSIS — F4325 Adjustment disorder with mixed disturbance of emotions and conduct: Secondary | ICD-10-CM | POA: Diagnosis not present

## 2022-06-14 DIAGNOSIS — F4325 Adjustment disorder with mixed disturbance of emotions and conduct: Secondary | ICD-10-CM | POA: Diagnosis not present

## 2022-06-16 ENCOUNTER — Telehealth: Payer: Self-pay | Admitting: Behavioral Health

## 2022-06-16 NOTE — Telephone Encounter (Signed)
Pt stated he has been having diarrhea from the lexapro.He is having it 4-5 times a day and can't function like this.He would like to stop med.

## 2022-06-16 NOTE — Telephone Encounter (Signed)
LVM with info

## 2022-06-16 NOTE — Telephone Encounter (Signed)
Pt lvm that he is having side effects on the medicine that Garlon prescribed. He is having diarrhea. And needs to stop the medicine. Please call him at [phone]

## 2022-06-16 NOTE — Telephone Encounter (Signed)
LVM to rtc 

## 2022-06-21 ENCOUNTER — Ambulatory Visit: Payer: BC Managed Care – PPO | Admitting: Behavioral Health

## 2022-06-21 DIAGNOSIS — F4325 Adjustment disorder with mixed disturbance of emotions and conduct: Secondary | ICD-10-CM | POA: Diagnosis not present

## 2022-06-27 ENCOUNTER — Ambulatory Visit (INDEPENDENT_AMBULATORY_CARE_PROVIDER_SITE_OTHER): Payer: BC Managed Care – PPO | Admitting: Behavioral Health

## 2022-06-27 ENCOUNTER — Encounter: Payer: Self-pay | Admitting: Behavioral Health

## 2022-06-27 ENCOUNTER — Other Ambulatory Visit (HOSPITAL_BASED_OUTPATIENT_CLINIC_OR_DEPARTMENT_OTHER): Payer: Self-pay

## 2022-06-27 DIAGNOSIS — F411 Generalized anxiety disorder: Secondary | ICD-10-CM | POA: Diagnosis not present

## 2022-06-27 DIAGNOSIS — F341 Dysthymic disorder: Secondary | ICD-10-CM

## 2022-06-27 DIAGNOSIS — F902 Attention-deficit hyperactivity disorder, combined type: Secondary | ICD-10-CM

## 2022-06-27 MED ORDER — BENZONATATE 200 MG PO CAPS
200.0000 mg | ORAL_CAPSULE | Freq: Three times a day (TID) | ORAL | 0 refills | Status: DC | PRN
  Filled 2022-06-27: qty 30, 10d supply, fill #0

## 2022-06-27 MED ORDER — METHYLPHENIDATE HCL ER 27 MG PO TB24
27.0000 mg | ORAL_TABLET | ORAL | 0 refills | Status: DC
  Filled 2022-06-27: qty 30, 30d supply, fill #0

## 2022-06-27 NOTE — Progress Notes (Signed)
[facility]  Patient ID: Benjamin [name],  MRN: [id number]  PCP: Shon Baton, MD  Date of Evaluation: 06/27/2022 Time spent:30 minutes  Chief Complaint:  Chief Complaint   Depression; Medication Refill; Follow-up; Medication Problem; Patient Education     HISTORY/CURRENT STATUS: HPI Benjamin [name]" , 47 year old male  is seen onsite in office face-to-face for follow up and medication management. Says that he had to stop the Lexapro as well due to diarrhea. For now he would like to just stay on his Wellbutrin.  Says his anxiety today is 3/10 and depression is 3/10. He is sleeping 7-8 hours per night.  He has strong family support and network of resources. Feel he is in a good place and feeling stable at this time.  He denies mania, no psychosis, no SI/HI. F/U in 4 weeks to reassess.   Prior psychiatric medications: Paxil Individual Medical History/ Review of Systems: Changes? :No   Allergies: Bee venom and Lisinopril  Current Medications:  Current Outpatient Medications:    methylphenidate (CONCERTA) 27 MG PO CR tablet, Take 1 tablet (27 mg total) by mouth every morning., Disp: 30 tablet, Rfl: 0   atorvastatin (LIPITOR) 80 MG tablet, Take 80 mg by mouth daily at 6 PM. , Disp: , Rfl:    buPROPion (WELLBUTRIN XL) 150 MG 24 hr tablet, Take 1 tablet (150 mg total) by mouth every morning., Disp: 90 tablet, Rfl: 1   Continuous Blood Gluc Sensor (FREESTYLE LIBRE 14 DAY SENSOR) MISC, , Disp: , Rfl:    escitalopram (LEXAPRO) 10 MG tablet, Take 1 tablet (10 mg total) by mouth daily., Disp: 30 tablet, Rfl: 1   HUMALOG 100 UNIT/ML injection, Inject into the skin. Insulin pump., Disp: , Rfl:    losartan (COZAAR) 25 MG tablet, Take 25 mg by mouth daily., Disp: , Rfl:    methylphenidate 27 MG PO TB24, Take 1 tablet (27 mg total) by mouth every morning., Disp: 30 tablet, Rfl: 0   methylphenidate 27 MG PO TB24, Take 1 tablet (27 mg total) by mouth every morning., Disp: 30 tablet, Rfl: 0    sildenafil (VIAGRA) 50 MG tablet, Take 50 mg by mouth daily as needed for erectile dysfunction., Disp: , Rfl:  Medication Side Effects: none  Family Medical/ Social History: Changes? No  MENTAL HEALTH EXAM:  There were no vitals taken for this visit.There is no height or weight on file to calculate BMI.  General Appearance: Casual, Neat, and Well Groomed  Eye Contact:  Good  Speech:  Clear and Coherent  Volume:  Normal  Mood:  Anxious and Depressed  Affect:  Congruent  Thought Process:  Coherent  Orientation:  Full (Time, Place, and Person)  Thought Content: Logical   Suicidal Thoughts:  No  Homicidal Thoughts:  No  Memory:  WNL  Judgement:  Good  Insight:  Good  Psychomotor Activity:  Normal  Concentration:  Concentration: Good  Recall:  Good  Fund of Knowledge: Good  Language: Good  Assets:  Desire for Improvement  ADL's:  Intact  Cognition: WNL  Prognosis:  Good    DIAGNOSES:    ICD-10-CM   1. Attention deficit hyperactivity disorder (ADHD), combined type  F90.2 methylphenidate (CONCERTA) 27 MG PO CR tablet    2. Persistent depressive disorder with atypical features, currently moderate  F34.1     3. Generalized anxiety disorder  F41.1       Receiving Psychotherapy: No    RECOMMENDATIONS:   "Greater than 50% of 30  min face to face time with patient was spent on counseling and coordination of care. We is discussed his current level of stability. He had to stop Lexapro now due to constant diarrhea.  He is very sensitive to [facility] and this common side effecdt.  Still reporting consistent irritability. He is currently receiving therapy and counseling with Georgia Dom.  We agreed for him to continue Concerta, 27 mg once daily. Will continue Wellbutrin 150 mg XL daily. Stopped Lexapro  Will report side effects or worsening symptoms promptly To follow up in 3 months to reassess. Discussed potential benefits, risks, and side effects of stimulants with patient to  include increased heart rate, palpitations, insomnia, increased anxiety, increased irritability, or decreased appetite.  Instructed patient to contact office if experiencing any significant tolerability issues.  Reviewed PDMP           Elwanda Brooklyn, NP

## 2022-06-28 DIAGNOSIS — F4325 Adjustment disorder with mixed disturbance of emotions and conduct: Secondary | ICD-10-CM | POA: Diagnosis not present

## 2022-07-03 DIAGNOSIS — Z1211 Encounter for screening for malignant neoplasm of colon: Secondary | ICD-10-CM | POA: Diagnosis not present

## 2022-07-03 DIAGNOSIS — Z1212 Encounter for screening for malignant neoplasm of rectum: Secondary | ICD-10-CM | POA: Diagnosis not present

## 2022-07-05 DIAGNOSIS — F4325 Adjustment disorder with mixed disturbance of emotions and conduct: Secondary | ICD-10-CM | POA: Diagnosis not present

## 2022-07-06 DIAGNOSIS — Z794 Long term (current) use of insulin: Secondary | ICD-10-CM | POA: Diagnosis not present

## 2022-07-06 DIAGNOSIS — E119 Type 2 diabetes mellitus without complications: Secondary | ICD-10-CM | POA: Diagnosis not present

## 2022-07-06 DIAGNOSIS — E1165 Type 2 diabetes mellitus with hyperglycemia: Secondary | ICD-10-CM | POA: Diagnosis not present

## 2022-07-06 DIAGNOSIS — H40013 Open angle with borderline findings, low risk, bilateral: Secondary | ICD-10-CM | POA: Diagnosis not present

## 2022-07-12 DIAGNOSIS — F4325 Adjustment disorder with mixed disturbance of emotions and conduct: Secondary | ICD-10-CM | POA: Diagnosis not present

## 2022-07-13 DIAGNOSIS — N182 Chronic kidney disease, stage 2 (mild): Secondary | ICD-10-CM | POA: Diagnosis not present

## 2022-07-13 DIAGNOSIS — E785 Hyperlipidemia, unspecified: Secondary | ICD-10-CM | POA: Diagnosis not present

## 2022-07-13 DIAGNOSIS — I10 Essential (primary) hypertension: Secondary | ICD-10-CM | POA: Diagnosis not present

## 2022-07-13 DIAGNOSIS — E1022 Type 1 diabetes mellitus with diabetic chronic kidney disease: Secondary | ICD-10-CM | POA: Diagnosis not present

## 2022-07-19 DIAGNOSIS — F4325 Adjustment disorder with mixed disturbance of emotions and conduct: Secondary | ICD-10-CM | POA: Diagnosis not present

## 2022-07-26 DIAGNOSIS — F4325 Adjustment disorder with mixed disturbance of emotions and conduct: Secondary | ICD-10-CM | POA: Diagnosis not present

## 2022-07-27 NOTE — Progress Notes (Signed)
PATIENT: Benjamin [name] DOB: November 24, 1975  REASON FOR VISIT: follow up HISTORY FROM: patient  Chief Complaint  Patient presents with   Follow-up    Pt in room 1, here for cpap follow up. Pt reports doing well on cpap.     HISTORY OF PRESENT ILLNESS:  07/31/22 ALL:  Benjamin [name] is a 47 y.o. male here today for follow up for OSA on CPAP.  He continues to do well. He is using CPAP night for about 6 hours. He denies concerns with machine or supplies.He has noted dry mouth despite maximum humidity settings. He occasionally notes a leak in mask. He has facial hair. Using FFM. He does need to replace mask.   He is followed by Lesle Chris, psychiatry, with [facility]. He continues methylphenidate CR '27mg'$  daily.     HISTORY: (copied from Dr Dohmeier's previous note)  Benjamin [name] is a 29 - year- old Caucasian male patient and is seen on 07/27/2021 . RV with new CPAP:  Benjamin [name] confirms that he feels he sleeps better than before CPAP was implemented, he is generally happy with his new CPAP which is a rest vent machine.  His Epworth Sleepiness Scale has been reduced to 9 points, he does not report excessive fatigue.  His compliance is excellent so he is using the I breeze 28 device with the serial number GB-to be [id number] for the last 8 months and 4 days now and his 95th percentile pressure in CPAP is 8.8 cm water.  His residual AHI is 2.9/h which is excellent he has no central apneas arising.  Occasionally he may still snore through the mask this is reflected in the rare average 4.5 per night.  He does have mediocre air symptoms actually his air leaks have increased since 11 February and that is when he is started using a new mask, same bottle supposedly same size but mask and headgear here refreshed at the time and I do wonder if it is not set to his best advantage.  The average pressure is 8.8 cm water and he has been compliant at 93% 4 hours and days which is an excellent compliance. He works  out 3-5 days a week, but had gained weight until December.   Travel CPAP and inspire device were discussed. He is not qualified by [facility] I think he will do better with a travel CPAP. Its personal travel , not job related. He may entertain a dental device   REVIEW OF SYSTEMS: Out of a complete 14 system review of symptoms, the patient complains only of the following symptoms, dry mouth, and all other reviewed systems are negative.  ESS: 10/24 FSS: 24/63  ALLERGIES: Allergies  Allergen Reactions   Bee Venom    Lisinopril     Other reaction(s): COUGH Other reaction(s): COUGH     HOME MEDICATIONS: Outpatient Medications Prior to Visit  Medication Sig Dispense Refill   atorvastatin (LIPITOR) 80 MG tablet Take 80 mg by mouth daily at 6 PM.      buPROPion (WELLBUTRIN XL) 150 MG 24 hr tablet Take 1 tablet (150 mg total) by mouth every morning. 90 tablet 1   Continuous Blood Gluc Transmit (DEXCOM G6 TRANSMITTER) MISC See admin instructions.     HUMALOG 100 UNIT/ML injection Inject into the skin. Insulin pump.     losartan (COZAAR) 25 MG tablet Take 25 mg by mouth daily.     methylphenidate 27 MG PO CR tablet Take 27 mg by mouth  every morning.     sildenafil (VIAGRA) 50 MG tablet Take 50 mg by mouth daily as needed for erectile dysfunction.     Continuous Blood Gluc Sensor (FREESTYLE LIBRE 14 DAY SENSOR) MISC      methylphenidate 27 MG PO TB24 Take 1 tablet (27 mg total) by mouth every morning. 30 tablet 0   benzonatate (TESSALON) 200 MG capsule Take 1 capsule (200 mg total) by mouth 3 (three) times daily as needed. (Patient not taking: Reported on 07/31/2022) 30 capsule 0   escitalopram (LEXAPRO) 10 MG tablet Take 1 tablet (10 mg total) by mouth daily. (Patient not taking: Reported on 07/31/2022) 30 tablet 1   methylphenidate 27 MG PO TB24 Take 1 tablet (27 mg total) by mouth every morning. 30 tablet 0   methylphenidate 27 MG PO TB24 Take 1 tablet (27 mg total) by mouth every morning. 30  tablet 0   No facility-administered medications prior to visit.    PAST MEDICAL HISTORY: Past Medical History:  Diagnosis Date   ADD (attention deficit disorder) 02/23/2018   Depression 02/23/2018   Diabetes type I ([place])     PAST SURGICAL HISTORY: History reviewed. No pertinent surgical history.  FAMILY HISTORY: History reviewed. No pertinent family history.  SOCIAL HISTORY: Social History   Socioeconomic History   Marital status: Married    Spouse name: Not on file   Number of children: Not on file   Years of education: Not on file   Highest education level: Not on file  Occupational History   Not on file  Tobacco Use   Smoking status: Never   Smokeless tobacco: Never  Vaping Use   Vaping Use: Never used  Substance and Sexual Activity   Alcohol use: Not Currently   Drug use: Not Currently    Types: Marijuana   Sexual activity: Yes  Other Topics Concern   Not on file  Social History Narrative   Not on file   Social Determinants of Health   Financial Resource Strain: Not on file  Food Insecurity: Not on file  Transportation Needs: Not on file  Physical Activity: Not on file  Stress: Not on file  Social Connections: Not on file  Intimate Partner Violence: Not on file     PHYSICAL EXAM  Vitals:   07/31/22 1004  BP: (!) 141/90  Pulse: 96  Weight: 277 lb 8 oz (125.9 kg)  Height: 6' (1.829 m)   Body mass index is 37.64 kg/m.  Generalized: Well developed, in no acute distress  Cardiology: normal rate and rhythm, no murmur noted Respiratory: clear to auscultation bilaterally  Neurological examination  Mentation: Alert oriented to time, place, history taking. Follows all commands speech and language fluent Cranial nerve II-XII: Pupils were equal round reactive to light. Extraocular movements were full, visual field were full  Motor: The motor testing reveals 5 over 5 strength of all 4 extremities. Good symmetric motor tone is noted throughout.  Gait  and station: Gait is normal.    DIAGNOSTIC DATA (LABS, IMAGING, TESTING) - I reviewed patient records, labs, notes, testing and imaging myself where available.      No data to display           Lab Results  Component Value Date   WBC 18.3 (H) 05/29/2019   HGB 16.8 05/29/2019   HCT 49.6 05/29/2019   MCV 87.0 05/29/2019   PLT 308 05/29/2019      Component Value Date/Time   NA 138 06/02/2019 0211  K 3.5 06/02/2019 0211   CL 106 06/02/2019 0211   CO2 23 06/02/2019 0211   GLUCOSE 112 (H) 06/02/2019 0211   BUN 10 06/02/2019 0211   CREATININE 0.63 06/02/2019 0211   CALCIUM 8.1 (L) 06/02/2019 0211   PROT 5.2 (L) 06/03/2019 0139   ALBUMIN 2.1 (L) 06/03/2019 0139   AST 11 (L) 06/03/2019 0139   ALT 11 06/03/2019 0139   ALKPHOS 58 06/03/2019 0139   BILITOT 0.4 06/03/2019 0139   GFRNONAA >60 06/02/2019 0211   GFRAA >60 06/02/2019 0211   Lab Results  Component Value Date   TRIG 787 (H) 05/29/2019   Lab Results  Component Value Date   HGBA1C 13.1 (H) 05/30/2019   No results found for: "VITAMINB12" No results found for: "TSH"   ASSESSMENT AND PLAN 47 y.o. year old male  has a past medical history of ADD (attention deficit disorder) (02/23/2018), Depression (02/23/2018), and Diabetes type I ([place]). here with     ICD-10-CM   1. OSA on CPAP  G47.33 For home use only DME continuous positive airway pressure (CPAP)       Benjamin [name] is doing well on CPAP therapy. Compliance report reveals excellent compliance. He was encouraged to continue using CPAP nightly and for greater than 4 hours each night. We have reviewed AHI of 5.5/h and elevated leak at 53l/min. I have encouraged him to monitor at home. He will replace mask as directed. Consider Biotene products for dry mouth. We will update supply orders as indicated. Risks of untreated sleep apnea review and education materials provided. Healthy lifestyle habits encouraged. I will recheck compliance report in 6-8 weeks to assess  leak and AHI. He will follow up in 1 year, sooner if needed. He verbalizes understanding and agreement with this plan.    Orders Placed This Encounter  Procedures   For home use only DME continuous positive airway pressure (CPAP)    Supplies    Order Specific Question:   Length of Need    Answer:   Lifetime    Order Specific Question:   Patient has OSA or probable OSA    Answer:   Yes    Order Specific Question:   Is the patient currently using CPAP in the home    Answer:   Yes    Order Specific Question:   Settings    Answer:   Other see comments    Order Specific Question:   CPAP supplies needed    Answer:   Mask, headgear, cushions, filters, heated tubing and water chamber     No orders of the defined types were placed in this encounter.     Debbora Presto, FNP-C 07/31/2022, 10:30 AM [facility] [address], [place], [place] [address] [phone]

## 2022-07-27 NOTE — Patient Instructions (Addendum)
Please continue using your CPAP regularly. While your insurance requires that you use CPAP at least 4 hours each night on 70% of the nights, I recommend, that you not skip any nights and use it throughout the night if you can. Getting used to CPAP and staying with the treatment long term does take time and patience and discipline. Untreated obstructive sleep apnea when it is moderate to severe can have an adverse impact on cardiovascular health and raise her risk for heart disease, arrhythmias, hypertension, congestive heart failure, stroke and diabetes. Untreated obstructive sleep apnea causes sleep disruption, nonrestorative sleep, and sleep deprivation. This can have an impact on your day to day functioning and cause daytime sleepiness and impairment of cognitive function, memory loss, mood disturbance, and problems focussing. Using CPAP regularly can improve these symptoms.  Please keep a eye on the air leak at home. Try replacing mask. Keep headgear snug. Use biotene products and humidity for dry mouth   Follow up in 1 year

## 2022-07-31 ENCOUNTER — Encounter: Payer: Self-pay | Admitting: Family Medicine

## 2022-07-31 ENCOUNTER — Ambulatory Visit (INDEPENDENT_AMBULATORY_CARE_PROVIDER_SITE_OTHER): Payer: BC Managed Care – PPO | Admitting: Family Medicine

## 2022-07-31 VITALS — BP 141/90 | HR 96 | Ht 72.0 in | Wt 277.5 lb

## 2022-07-31 DIAGNOSIS — G4733 Obstructive sleep apnea (adult) (pediatric): Secondary | ICD-10-CM

## 2022-08-02 DIAGNOSIS — E1165 Type 2 diabetes mellitus with hyperglycemia: Secondary | ICD-10-CM | POA: Diagnosis not present

## 2022-08-02 DIAGNOSIS — Z794 Long term (current) use of insulin: Secondary | ICD-10-CM | POA: Diagnosis not present

## 2022-08-02 DIAGNOSIS — F4325 Adjustment disorder with mixed disturbance of emotions and conduct: Secondary | ICD-10-CM | POA: Diagnosis not present

## 2022-08-02 DIAGNOSIS — E119 Type 2 diabetes mellitus without complications: Secondary | ICD-10-CM | POA: Diagnosis not present

## 2022-08-09 DIAGNOSIS — F4325 Adjustment disorder with mixed disturbance of emotions and conduct: Secondary | ICD-10-CM | POA: Diagnosis not present

## 2022-08-16 DIAGNOSIS — F4325 Adjustment disorder with mixed disturbance of emotions and conduct: Secondary | ICD-10-CM | POA: Diagnosis not present

## 2022-08-23 DIAGNOSIS — F4325 Adjustment disorder with mixed disturbance of emotions and conduct: Secondary | ICD-10-CM | POA: Diagnosis not present

## 2022-08-25 ENCOUNTER — Other Ambulatory Visit (HOSPITAL_BASED_OUTPATIENT_CLINIC_OR_DEPARTMENT_OTHER): Payer: Self-pay

## 2022-08-25 ENCOUNTER — Telehealth: Payer: Self-pay | Admitting: Behavioral Health

## 2022-08-25 ENCOUNTER — Other Ambulatory Visit: Payer: Self-pay | Admitting: Behavioral Health

## 2022-08-25 DIAGNOSIS — F902 Attention-deficit hyperactivity disorder, combined type: Secondary | ICD-10-CM

## 2022-08-25 MED ORDER — METHYLPHENIDATE HCL ER (OSM) 27 MG PO TBCR
27.0000 mg | EXTENDED_RELEASE_TABLET | ORAL | 0 refills | Status: DC
  Filled 2022-08-25: qty 30, 30d supply, fill #0

## 2022-08-25 NOTE — Telephone Encounter (Signed)
Pt would like refill of Methylphenidate sent to  [place] at [facility].  Next appt 4/23

## 2022-08-30 DIAGNOSIS — F4325 Adjustment disorder with mixed disturbance of emotions and conduct: Secondary | ICD-10-CM | POA: Diagnosis not present

## 2022-09-06 DIAGNOSIS — F4325 Adjustment disorder with mixed disturbance of emotions and conduct: Secondary | ICD-10-CM | POA: Diagnosis not present

## 2022-09-13 DIAGNOSIS — F4325 Adjustment disorder with mixed disturbance of emotions and conduct: Secondary | ICD-10-CM | POA: Diagnosis not present

## 2022-09-20 DIAGNOSIS — F4325 Adjustment disorder with mixed disturbance of emotions and conduct: Secondary | ICD-10-CM | POA: Diagnosis not present

## 2022-09-26 ENCOUNTER — Encounter: Payer: Self-pay | Admitting: Behavioral Health

## 2022-09-26 ENCOUNTER — Ambulatory Visit (INDEPENDENT_AMBULATORY_CARE_PROVIDER_SITE_OTHER): Payer: BC Managed Care – PPO | Admitting: Behavioral Health

## 2022-09-26 ENCOUNTER — Other Ambulatory Visit (HOSPITAL_BASED_OUTPATIENT_CLINIC_OR_DEPARTMENT_OTHER): Payer: Self-pay

## 2022-09-26 ENCOUNTER — Other Ambulatory Visit: Payer: Self-pay

## 2022-09-26 DIAGNOSIS — F902 Attention-deficit hyperactivity disorder, combined type: Secondary | ICD-10-CM

## 2022-09-26 MED ORDER — METHYLPHENIDATE HCL ER (OSM) 27 MG PO TBCR
27.0000 mg | EXTENDED_RELEASE_TABLET | ORAL | 0 refills | Status: DC
  Filled 2022-09-26: qty 30, 30d supply, fill #0

## 2022-09-26 NOTE — Progress Notes (Signed)
[facility]  Patient ID: TIP ATIENZA,  MRN: [id number]  PCP: Creola Corn, MD  Date of Evaluation: 09/26/2022 Time spent:30 minutes  Chief Complaint:  Chief Complaint   Anxiety; Depression; Follow-up; Patient Education; Medication Refill     HISTORY/CURRENT STATUS: HPI "Benjamin [name]" , 47 year old male  is seen onsite in office face-to-face for follow up and medication management.  Says that he is in a good place with medication. For now he would like to just stay on his Wellbutrin and methylphenidate. Says his anxiety today is 2/10 and depression is 2/10. He is sleeping 7-8 hours per night.  He has strong family support and network of resources. Feel he is in a good place and feeling stable at this time.  He denies mania, no psychosis, no SI/HI. F/U in 4 weeks to reassess.   Prior psychiatric medications: Paxil Individual Medical History/ Review of Systems: Changes? :No   Allergies: Bee venom and Lisinopril  Current Medications:  Current Outpatient Medications:    atorvastatin (LIPITOR) 80 MG tablet, Take 80 mg by mouth daily at 6 PM. , Disp: , Rfl:    buPROPion (WELLBUTRIN XL) 150 MG 24 hr tablet, Take 1 tablet (150 mg total) by mouth every morning., Disp: 90 tablet, Rfl: 1   Continuous Blood Gluc Transmit (DEXCOM G6 TRANSMITTER) MISC, See admin instructions., Disp: , Rfl:    HUMALOG 100 UNIT/ML injection, Inject into the skin. Insulin pump., Disp: , Rfl:    losartan (COZAAR) 25 MG tablet, Take 25 mg by mouth daily., Disp: , Rfl:    methylphenidate 27 MG PO CR tablet, Take 1 tablet (27 mg total) by mouth every morning., Disp: 30 tablet, Rfl: 0   sildenafil (VIAGRA) 50 MG tablet, Take 50 mg by mouth daily as needed for erectile dysfunction., Disp: , Rfl:  Medication Side Effects: none  Family Medical/ Social History: Changes? No  MENTAL HEALTH EXAM:  There were no vitals taken for this visit.There is no height or weight on file to calculate BMI.  General Appearance:  Casual, Neat, and Well Groomed  Eye Contact:  Good  Speech:  Clear and Coherent  Volume:  Normal  Mood:  Anxious, Depressed, and Dysphoric  Affect:  Appropriate  Thought Process:  Coherent  Orientation:  Full (Time, Place, and Person)  Thought Content: Logical   Suicidal Thoughts:  No  Homicidal Thoughts:  No  Memory:  WNL  Judgement:  Good  Insight:  Good  Psychomotor Activity:  Normal  Concentration:  Concentration: Good  Recall:  Good  Fund of Knowledge: Good  Language: Good  Assets:  Desire for Improvement  ADL's:  Intact  Cognition: WNL  Prognosis:  Good    DIAGNOSES:    ICD-10-CM   1. Attention deficit hyperactivity disorder (ADHD), combined type  F90.2 methylphenidate 27 MG PO CR tablet      Receiving Psychotherapy: No    RECOMMENDATIONS:   Greater than 50% of 30 min face to face time with patient was spent on counseling and coordination of care. No social changes this visit. We is discussed his current level of stability.  He is very sensitive to [facility] and this common side effect.  Still reporting consistent irritability. He is currently receiving therapy and counseling with [name].  We agreed for him to continue Concerta, 27 mg once daily. Will continue Wellbutrin 150 mg XL daily. Stopped Lexapro  Will report side effects or worsening symptoms promptly To follow up in 3 months to reassess.  Discussed potential benefits, risks, and side effects of stimulants with patient to include increased heart rate, palpitations, insomnia, increased anxiety, increased irritability, or decreased appetite.  Instructed patient to contact office if experiencing any significant tolerability issues.  Reviewed PDMP   Joan Flores, NP

## 2022-09-28 ENCOUNTER — Other Ambulatory Visit (HOSPITAL_BASED_OUTPATIENT_CLINIC_OR_DEPARTMENT_OTHER): Payer: Self-pay

## 2022-10-03 DIAGNOSIS — E1022 Type 1 diabetes mellitus with diabetic chronic kidney disease: Secondary | ICD-10-CM | POA: Diagnosis not present

## 2022-10-03 DIAGNOSIS — E10319 Type 1 diabetes mellitus with unspecified diabetic retinopathy without macular edema: Secondary | ICD-10-CM | POA: Diagnosis not present

## 2022-10-03 DIAGNOSIS — E1165 Type 2 diabetes mellitus with hyperglycemia: Secondary | ICD-10-CM | POA: Diagnosis not present

## 2022-10-03 DIAGNOSIS — N182 Chronic kidney disease, stage 2 (mild): Secondary | ICD-10-CM | POA: Diagnosis not present

## 2022-10-03 DIAGNOSIS — E119 Type 2 diabetes mellitus without complications: Secondary | ICD-10-CM | POA: Diagnosis not present

## 2022-10-03 DIAGNOSIS — Z794 Long term (current) use of insulin: Secondary | ICD-10-CM | POA: Diagnosis not present

## 2022-10-04 DIAGNOSIS — F4325 Adjustment disorder with mixed disturbance of emotions and conduct: Secondary | ICD-10-CM | POA: Diagnosis not present

## 2022-10-11 ENCOUNTER — Telehealth: Payer: Self-pay | Admitting: Family Medicine

## 2022-10-11 NOTE — Telephone Encounter (Signed)
Called pt and told him that Amy wants him to bring his card in so she can see if pt has a leak in his CPAP Machine and to see if his Sleep Apnea is being well managed. Pt states that he will bring it in next week. Pt verbalized understanding.

## 2022-10-11 NOTE — Telephone Encounter (Signed)
Please let him know that if he can bring his card byt he office I will be happy to check the air leak and make sure his apnea is well managed. AHI was elevated at last visit and felt to be due to air leak.

## 2022-10-18 DIAGNOSIS — F4325 Adjustment disorder with mixed disturbance of emotions and conduct: Secondary | ICD-10-CM | POA: Diagnosis not present

## 2022-10-25 DIAGNOSIS — F4325 Adjustment disorder with mixed disturbance of emotions and conduct: Secondary | ICD-10-CM | POA: Diagnosis not present

## 2022-11-01 DIAGNOSIS — F4325 Adjustment disorder with mixed disturbance of emotions and conduct: Secondary | ICD-10-CM | POA: Diagnosis not present

## 2022-11-07 IMAGING — CT CT CARDIAC CORONARY ARTERY CALCIUM SCORE
3 series · 14 of 20 positions shown, 16 images · non-contrast
Comparison: None.

CLINICAL DATA: 45-year-old Caucasian male with history of
hyperlipidemia and family history of heart disease.

EXAM:
CT CARDIAC CORONARY ARTERY CALCIUM SCORE
TECHNIQUE: Non-contrast imaging through the heart was performed using
prospective ECG gating. Image post processing was performed on an
independent workstation, allowing for quantitative analysis of the
heart and coronary arteries. Note that this exam targets the heart
and the chest was not imaged in its entirety.

[Series 2: calcium scoring 2.00 qr36 bestdiast 71% hrt calciu · axial · 0.37mm/px · z∈[+1547,+1629]mm · 4 of 69 slices shown]
[im 14/69  vessel]
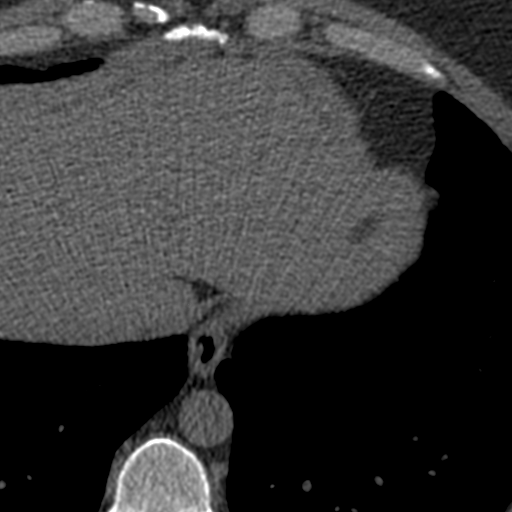
[im 28/69  vessel]
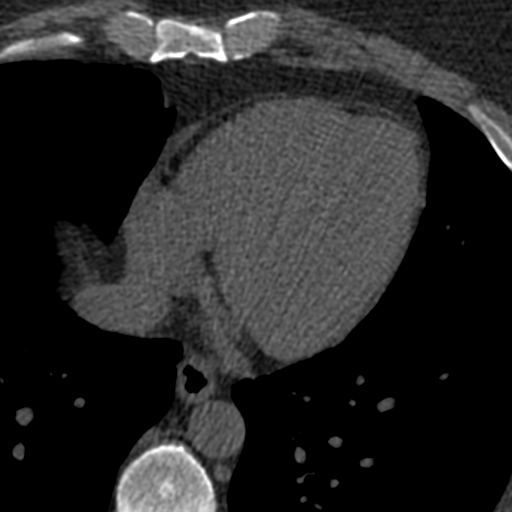
[im 41/69  vessel]
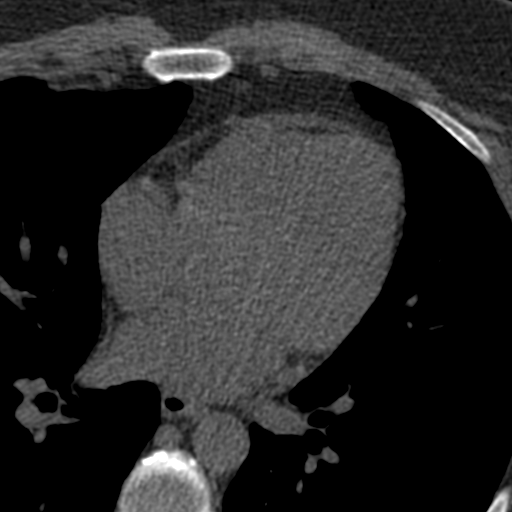
[im 55/69  vessel]
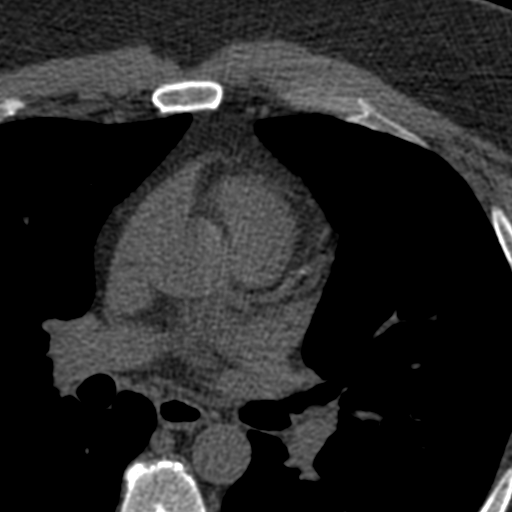

[Series 3: calcium scoring 2.00 br40 bestdiast 71% axial · axial · 0.72mm/px · z∈[+1507,+1627]mm · 5 of 90 slices shown, 7 images]
[im 15/90  vessel]
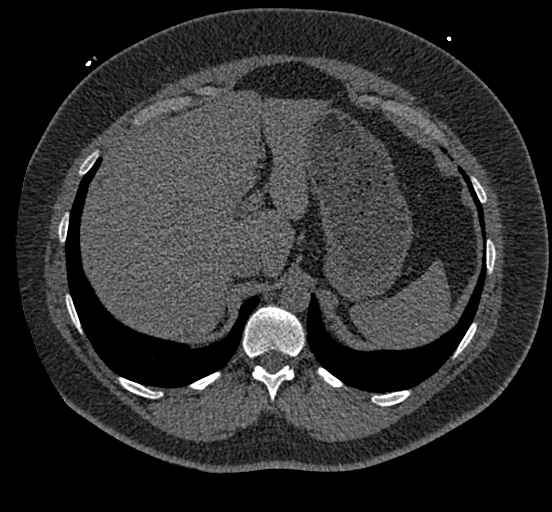
[im 15/90  lung]
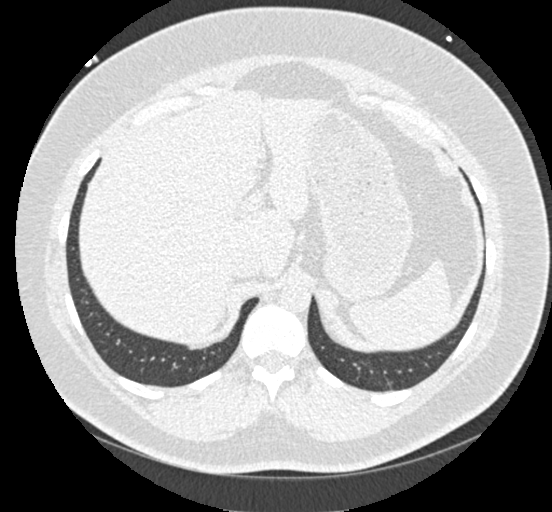
[im 30/90  vessel]
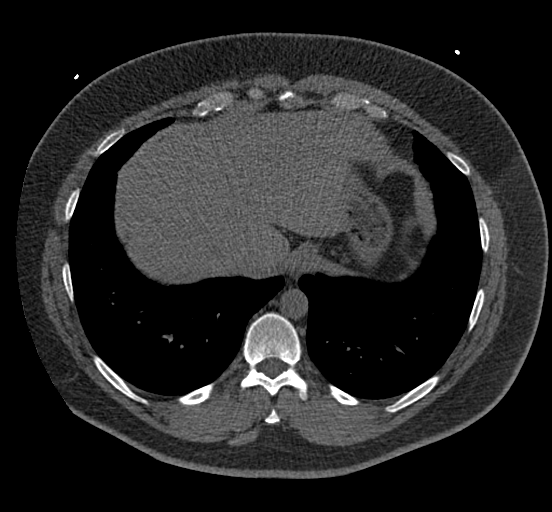
[im 45/90  vessel]
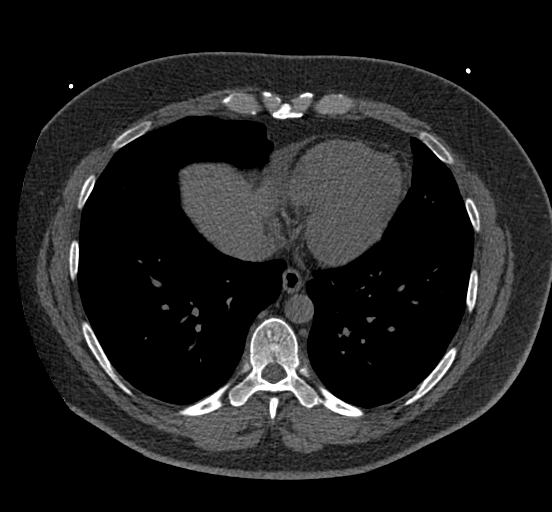
[im 60/90  vessel]
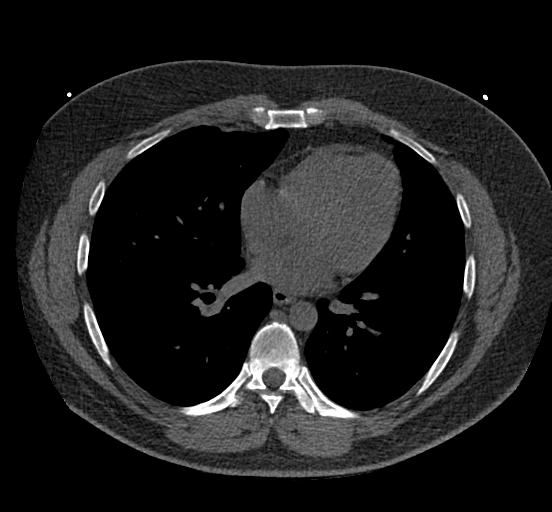
[im 75/90  vessel]
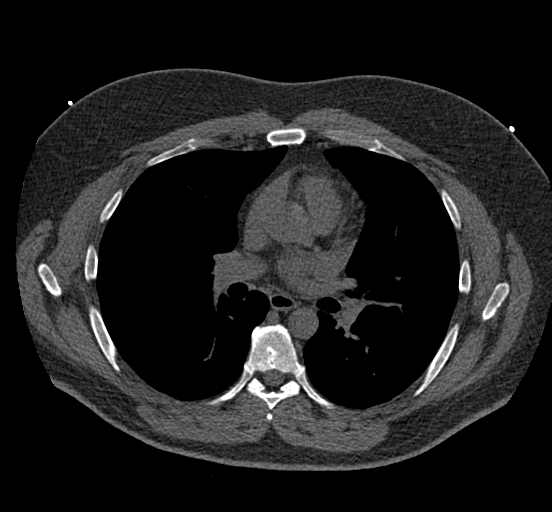
[im 75/90  lung]
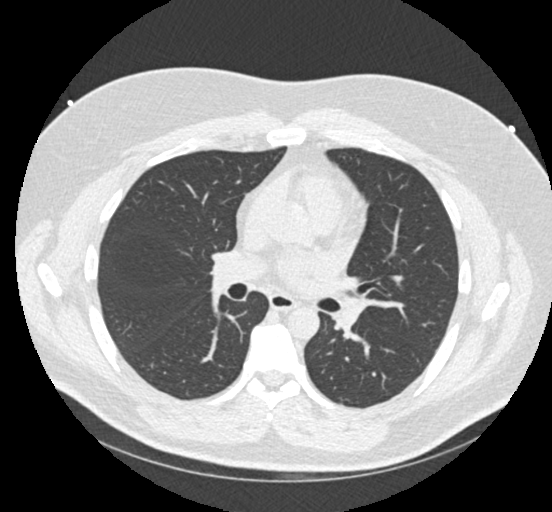

[Series 9: calcium scoring 2.00 br60 bestdiast 71% lungs · axial · 0.72mm/px · z∈[+1507,+1627]mm · 5 of 90 slices shown]
[im 15/90  vessel]
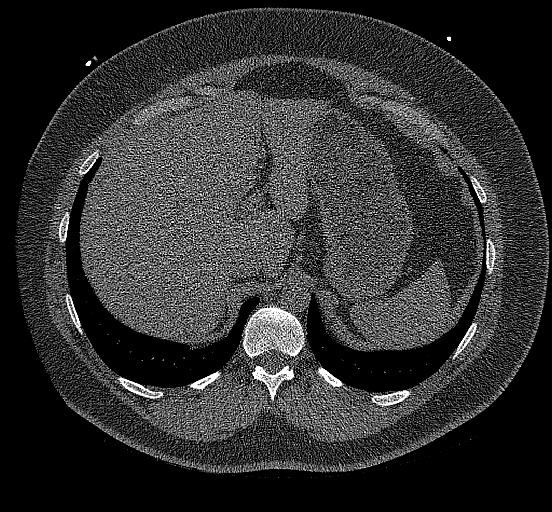
[im 30/90  vessel]
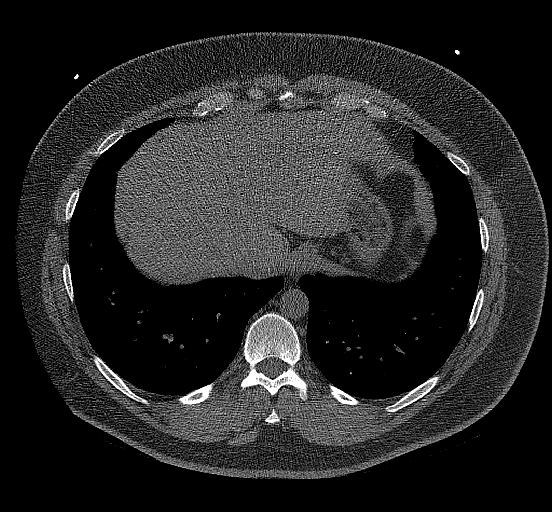
[im 45/90  vessel]
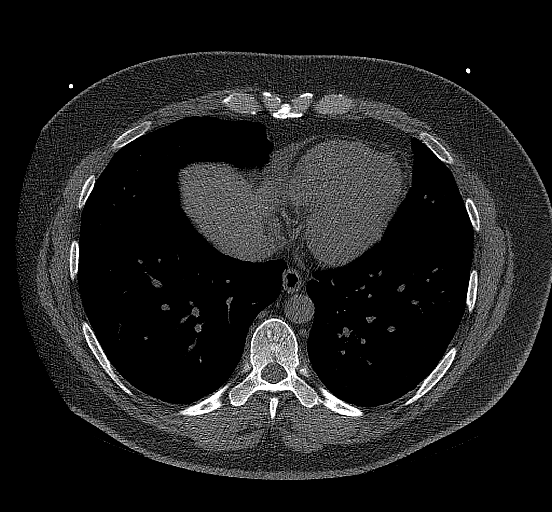
[im 60/90  vessel]
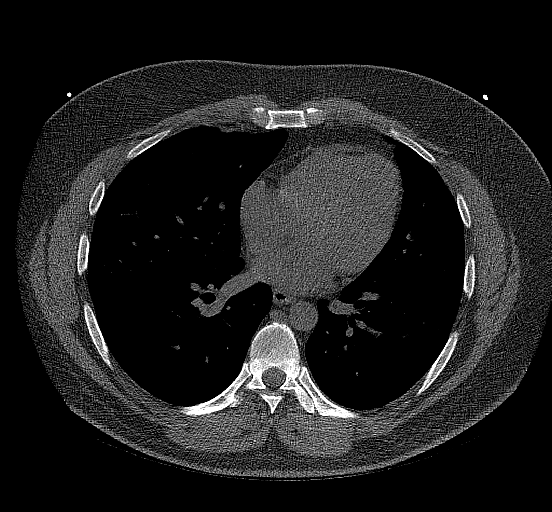
[im 75/90  vessel]
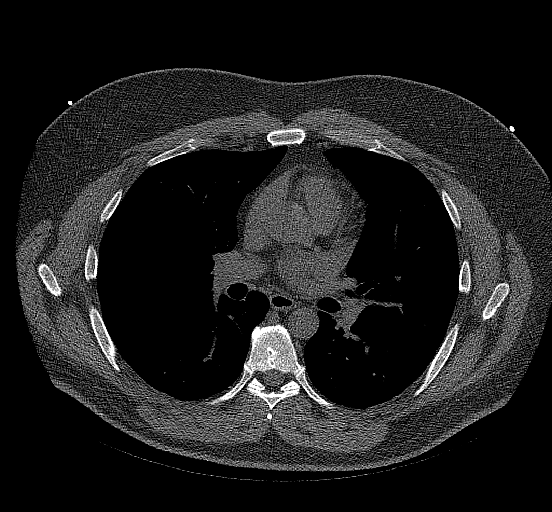

[14 of 20 positions shown; findings below may reference images not displayed]

FINDINGS: CORONARY CALCIUM SCORES:

Left Main: 0

LAD:

LCx: 0

RCA: 0

Total Agatston Score:

[HOSPITAL] percentile: Percentile data is not available for
patients under the age of 45. A score of 4.2 in a 46-year-old
Caucasian male is at the 79th percentile.

AORTA MEASUREMENTS:

Ascending Aorta: 31 mm

Descending Aorta: 21 mm

OTHER FINDINGS:

The heart size is within normal limits. No pericardial fluid is
identified. Visualized segments of the thoracic aorta and central
pulmonary arteries are normal in caliber. Visualized mediastinum and
hilar regions demonstrate no lymphadenopathy or masses. Visualized
lungs show no evidence of pulmonary edema, consolidation,
pneumothorax, nodule or pleural fluid. Visualized upper abdomen and
bony structures are unremarkable.
IMPRESSION: Coronary calcium score of 4.2. Percentile data is not available for
patients under the age of 45. A score of 4.2 in a 46-year-old
Caucasian male is at the 79th percentile.

## 2022-11-08 DIAGNOSIS — F4325 Adjustment disorder with mixed disturbance of emotions and conduct: Secondary | ICD-10-CM | POA: Diagnosis not present

## 2022-11-09 DIAGNOSIS — E1022 Type 1 diabetes mellitus with diabetic chronic kidney disease: Secondary | ICD-10-CM | POA: Diagnosis not present

## 2022-11-09 DIAGNOSIS — E113393 Type 2 diabetes mellitus with moderate nonproliferative diabetic retinopathy without macular edema, bilateral: Secondary | ICD-10-CM | POA: Diagnosis not present

## 2022-11-09 DIAGNOSIS — E785 Hyperlipidemia, unspecified: Secondary | ICD-10-CM | POA: Diagnosis not present

## 2022-11-09 DIAGNOSIS — I10 Essential (primary) hypertension: Secondary | ICD-10-CM | POA: Diagnosis not present

## 2022-11-09 DIAGNOSIS — E10319 Type 1 diabetes mellitus with unspecified diabetic retinopathy without macular edema: Secondary | ICD-10-CM | POA: Diagnosis not present

## 2022-11-15 DIAGNOSIS — F4325 Adjustment disorder with mixed disturbance of emotions and conduct: Secondary | ICD-10-CM | POA: Diagnosis not present

## 2022-11-16 DIAGNOSIS — E1165 Type 2 diabetes mellitus with hyperglycemia: Secondary | ICD-10-CM | POA: Diagnosis not present

## 2022-11-16 DIAGNOSIS — E119 Type 2 diabetes mellitus without complications: Secondary | ICD-10-CM | POA: Diagnosis not present

## 2022-11-16 DIAGNOSIS — Z794 Long term (current) use of insulin: Secondary | ICD-10-CM | POA: Diagnosis not present

## 2022-11-22 DIAGNOSIS — F4325 Adjustment disorder with mixed disturbance of emotions and conduct: Secondary | ICD-10-CM | POA: Diagnosis not present

## 2022-11-28 DIAGNOSIS — H43812 Vitreous degeneration, left eye: Secondary | ICD-10-CM | POA: Diagnosis not present

## 2022-11-29 DIAGNOSIS — F4325 Adjustment disorder with mixed disturbance of emotions and conduct: Secondary | ICD-10-CM | POA: Diagnosis not present

## 2022-12-01 ENCOUNTER — Other Ambulatory Visit (HOSPITAL_BASED_OUTPATIENT_CLINIC_OR_DEPARTMENT_OTHER): Payer: Self-pay

## 2022-12-01 MED ORDER — INSULIN LISPRO 100 UNIT/ML IJ SOLN
INTRAMUSCULAR | 0 refills | Status: AC
  Filled 2022-12-01: qty 20, 10d supply, fill #0

## 2022-12-18 ENCOUNTER — Other Ambulatory Visit (HOSPITAL_BASED_OUTPATIENT_CLINIC_OR_DEPARTMENT_OTHER): Payer: Self-pay

## 2022-12-18 DIAGNOSIS — E113311 Type 2 diabetes mellitus with moderate nonproliferative diabetic retinopathy with macular edema, right eye: Secondary | ICD-10-CM | POA: Diagnosis not present

## 2022-12-18 DIAGNOSIS — H4312 Vitreous hemorrhage, left eye: Secondary | ICD-10-CM | POA: Diagnosis not present

## 2022-12-18 DIAGNOSIS — E113592 Type 2 diabetes mellitus with proliferative diabetic retinopathy without macular edema, left eye: Secondary | ICD-10-CM | POA: Diagnosis not present

## 2022-12-18 DIAGNOSIS — H33052 Total retinal detachment, left eye: Secondary | ICD-10-CM | POA: Diagnosis not present

## 2022-12-18 DIAGNOSIS — H3582 Retinal ischemia: Secondary | ICD-10-CM | POA: Diagnosis not present

## 2022-12-18 DIAGNOSIS — H35371 Puckering of macula, right eye: Secondary | ICD-10-CM | POA: Diagnosis not present

## 2022-12-18 MED ORDER — PREDNISOLONE ACETATE 1 % OP SUSP
1.0000 [drp] | Freq: Four times a day (QID) | OPHTHALMIC | 5 refills | Status: AC
  Filled 2022-12-18: qty 5, 25d supply, fill #0

## 2022-12-18 MED ORDER — OFLOXACIN 0.3 % OP SOLN
1.0000 [drp] | Freq: Four times a day (QID) | OPHTHALMIC | 5 refills | Status: DC
  Filled 2022-12-18: qty 5, 25d supply, fill #0

## 2022-12-19 ENCOUNTER — Other Ambulatory Visit (HOSPITAL_BASED_OUTPATIENT_CLINIC_OR_DEPARTMENT_OTHER): Payer: Self-pay

## 2022-12-20 DIAGNOSIS — E113592 Type 2 diabetes mellitus with proliferative diabetic retinopathy without macular edema, left eye: Secondary | ICD-10-CM | POA: Diagnosis not present

## 2022-12-20 DIAGNOSIS — H4312 Vitreous hemorrhage, left eye: Secondary | ICD-10-CM | POA: Diagnosis not present

## 2022-12-26 ENCOUNTER — Encounter: Payer: Self-pay | Admitting: Behavioral Health

## 2022-12-26 ENCOUNTER — Other Ambulatory Visit (HOSPITAL_BASED_OUTPATIENT_CLINIC_OR_DEPARTMENT_OTHER): Payer: Self-pay

## 2022-12-26 ENCOUNTER — Ambulatory Visit (INDEPENDENT_AMBULATORY_CARE_PROVIDER_SITE_OTHER): Payer: BC Managed Care – PPO | Admitting: Behavioral Health

## 2022-12-26 DIAGNOSIS — F902 Attention-deficit hyperactivity disorder, combined type: Secondary | ICD-10-CM | POA: Diagnosis not present

## 2022-12-26 MED ORDER — METHYLPHENIDATE HCL ER (OSM) 27 MG PO TBCR
27.0000 mg | EXTENDED_RELEASE_TABLET | ORAL | 0 refills | Status: DC
  Filled 2022-12-26: qty 30, 30d supply, fill #0

## 2022-12-26 NOTE — Progress Notes (Signed)
[facility]  Patient ID: Benjamin [name],  MRN: [id number]  PCP: Benjamin Corn, MD  Date of Evaluation: 12/26/2022 Time spent:30 minutes  Chief Complaint:  Chief Complaint   Anxiety; Depression; Follow-up; Medication Refill; Patient Education     HISTORY/CURRENT STATUS: HPI  "Benjamin [name]" , 47 year old male  is seen onsite in office face-to-face for follow up and medication management.  Says that he is in a good place with medication. For now he would like to just stay on his Wellbutrin and methylphenidate. Says his anxiety today is 2/10 and depression is 2/10. He is sleeping 7-8 hours per night.  He has strong family support and network of resources. Feel he is in a good place and feeling stable at this time.  He denies mania, no psychosis, no SI/HI. F/U in 4 weeks to reassess.   Prior psychiatric medications: Paxil     Individual Medical History/ Review of Systems: Changes? :No   Allergies: Bee venom and Lisinopril  Current Medications:  Current Outpatient Medications:    atorvastatin (LIPITOR) 80 MG tablet, Take 80 mg by mouth daily at 6 PM. , Disp: , Rfl:    buPROPion (WELLBUTRIN XL) 150 MG 24 hr tablet, Take 1 tablet (150 mg total) by mouth every morning., Disp: 90 tablet, Rfl: 1   Continuous Blood Gluc Transmit (DEXCOM G6 TRANSMITTER) MISC, See admin instructions., Disp: , Rfl:    HUMALOG 100 UNIT/ML injection, Inject into the skin. Insulin pump., Disp: , Rfl:    insulin lispro (HUMALOG) 100 UNIT/ML injection, USE UP TO 200 UNITS UNDER THE SKIN DAILY VIA INSULIN PUMP-patient needs Rx r/t waiting on mail order Pharmacy and needs to vials to pick up today, Disp: 20 mL, Rfl: 0   losartan (COZAAR) 25 MG tablet, Take 25 mg by mouth daily., Disp: , Rfl:    methylphenidate 27 MG PO CR tablet, Take 1 tablet (27 mg total) by mouth every morning., Disp: 30 tablet, Rfl: 0   ofloxacin (OCUFLOX) 0.3 % ophthalmic solution, Place 1 drop into the left eye 4 (four) times daily, begin  one day prior to surgery continue as directed, Disp: 5 mL, Rfl: 5   prednisoLONE acetate (PRED FORTE) 1 % ophthalmic suspension, Place 1 drop into the left eye 4 (four) times daily; begin one day after surgery continue as directed, Disp: 10 mL, Rfl: 5   sildenafil (VIAGRA) 50 MG tablet, Take 50 mg by mouth daily as needed for erectile dysfunction., Disp: , Rfl:  Medication Side Effects: none  Family Medical/ Social History: Changes? No  MENTAL HEALTH EXAM:  There were no vitals taken for this visit.There is no height or weight on file to calculate BMI.  General Appearance: Casual, Neat, and Well Groomed  Eye Contact:  Good  Speech:  Clear and Coherent  Volume:  Normal  Mood:  Anxious, Depressed, and Dysphoric  Affect:  Appropriate  Thought Process:  Coherent  Orientation:  Full (Time, Place, and Person)  Thought Content: Logical   Suicidal Thoughts:  No  Homicidal Thoughts:  No  Memory:  WNL  Judgement:  Good  Insight:  Good  Psychomotor Activity:  Normal  Concentration:  Concentration: Good  Recall:  Good  Fund of Knowledge: Good  Language: Good  Assets:  Desire for Improvement  ADL's:  Intact  Cognition: WNL  Prognosis:  Good    DIAGNOSES:    ICD-10-CM   1. Attention deficit hyperactivity disorder (ADHD), combined type  F90.2 methylphenidate 27 MG PO CR  tablet      Receiving Psychotherapy: No    RECOMMENDATIONS:   Greater than 50% of 30 min face to face time with patient was spent on counseling and coordination of care. No social changes this visit. We is discussed his current level of stability.  He is very sensitive to [employer] and this common side effect.  Still reporting consistent irritability. He is currently receiving therapy and counseling with [name].  We agreed for him to continue Concerta, 27 mg once daily. Will continue Wellbutrin 150 mg XL daily. Stopped Lexapro  Will report side effects or worsening symptoms promptly To follow up in 3 months to  reassess. Discussed potential benefits, risks, and side effects of stimulants with patient to include increased heart rate, palpitations, insomnia, increased anxiety, increased irritability, or decreased appetite.  Instructed patient to contact office if experiencing any significant tolerability issues.  Reviewed PDMP        Joan Flores, NP

## 2022-12-27 DIAGNOSIS — F4325 Adjustment disorder with mixed disturbance of emotions and conduct: Secondary | ICD-10-CM | POA: Diagnosis not present

## 2022-12-28 DIAGNOSIS — Z9889 Other specified postprocedural states: Secondary | ICD-10-CM | POA: Diagnosis not present

## 2022-12-28 DIAGNOSIS — E113592 Type 2 diabetes mellitus with proliferative diabetic retinopathy without macular edema, left eye: Secondary | ICD-10-CM | POA: Diagnosis not present

## 2023-01-03 DIAGNOSIS — F4325 Adjustment disorder with mixed disturbance of emotions and conduct: Secondary | ICD-10-CM | POA: Diagnosis not present

## 2023-01-05 DIAGNOSIS — E119 Type 2 diabetes mellitus without complications: Secondary | ICD-10-CM | POA: Diagnosis not present

## 2023-01-05 DIAGNOSIS — Z794 Long term (current) use of insulin: Secondary | ICD-10-CM | POA: Diagnosis not present

## 2023-01-05 DIAGNOSIS — E1165 Type 2 diabetes mellitus with hyperglycemia: Secondary | ICD-10-CM | POA: Diagnosis not present

## 2023-01-09 ENCOUNTER — Other Ambulatory Visit (HOSPITAL_BASED_OUTPATIENT_CLINIC_OR_DEPARTMENT_OTHER): Payer: Self-pay

## 2023-01-09 DIAGNOSIS — R051 Acute cough: Secondary | ICD-10-CM | POA: Diagnosis not present

## 2023-01-09 DIAGNOSIS — E1022 Type 1 diabetes mellitus with diabetic chronic kidney disease: Secondary | ICD-10-CM | POA: Diagnosis not present

## 2023-01-09 DIAGNOSIS — J069 Acute upper respiratory infection, unspecified: Secondary | ICD-10-CM | POA: Diagnosis not present

## 2023-01-09 MED ORDER — AZITHROMYCIN 250 MG PO TABS
ORAL_TABLET | ORAL | 0 refills | Status: DC
  Filled 2023-01-09: qty 6, 5d supply, fill #0

## 2023-01-10 DIAGNOSIS — F4325 Adjustment disorder with mixed disturbance of emotions and conduct: Secondary | ICD-10-CM | POA: Diagnosis not present

## 2023-01-17 DIAGNOSIS — F4325 Adjustment disorder with mixed disturbance of emotions and conduct: Secondary | ICD-10-CM | POA: Diagnosis not present

## 2023-01-24 DIAGNOSIS — F4325 Adjustment disorder with mixed disturbance of emotions and conduct: Secondary | ICD-10-CM | POA: Diagnosis not present

## 2023-01-25 DIAGNOSIS — E113592 Type 2 diabetes mellitus with proliferative diabetic retinopathy without macular edema, left eye: Secondary | ICD-10-CM | POA: Diagnosis not present

## 2023-01-31 DIAGNOSIS — F4325 Adjustment disorder with mixed disturbance of emotions and conduct: Secondary | ICD-10-CM | POA: Diagnosis not present

## 2023-02-06 DIAGNOSIS — E1022 Type 1 diabetes mellitus with diabetic chronic kidney disease: Secondary | ICD-10-CM | POA: Diagnosis not present

## 2023-02-07 DIAGNOSIS — F4325 Adjustment disorder with mixed disturbance of emotions and conduct: Secondary | ICD-10-CM | POA: Diagnosis not present

## 2023-02-20 DIAGNOSIS — E119 Type 2 diabetes mellitus without complications: Secondary | ICD-10-CM | POA: Diagnosis not present

## 2023-02-20 DIAGNOSIS — Z794 Long term (current) use of insulin: Secondary | ICD-10-CM | POA: Diagnosis not present

## 2023-02-20 DIAGNOSIS — E1165 Type 2 diabetes mellitus with hyperglycemia: Secondary | ICD-10-CM | POA: Diagnosis not present

## 2023-02-28 DIAGNOSIS — F4325 Adjustment disorder with mixed disturbance of emotions and conduct: Secondary | ICD-10-CM | POA: Diagnosis not present

## 2023-02-28 DIAGNOSIS — F900 Attention-deficit hyperactivity disorder, predominantly inattentive type: Secondary | ICD-10-CM | POA: Diagnosis not present

## 2023-03-07 DIAGNOSIS — F900 Attention-deficit hyperactivity disorder, predominantly inattentive type: Secondary | ICD-10-CM | POA: Diagnosis not present

## 2023-03-07 DIAGNOSIS — F4325 Adjustment disorder with mixed disturbance of emotions and conduct: Secondary | ICD-10-CM | POA: Diagnosis not present

## 2023-03-16 DIAGNOSIS — H40013 Open angle with borderline findings, low risk, bilateral: Secondary | ICD-10-CM | POA: Diagnosis not present

## 2023-03-21 DIAGNOSIS — F900 Attention-deficit hyperactivity disorder, predominantly inattentive type: Secondary | ICD-10-CM | POA: Diagnosis not present

## 2023-03-21 DIAGNOSIS — F4325 Adjustment disorder with mixed disturbance of emotions and conduct: Secondary | ICD-10-CM | POA: Diagnosis not present

## 2023-03-29 ENCOUNTER — Ambulatory Visit: Payer: BC Managed Care – PPO | Admitting: Behavioral Health

## 2023-03-29 ENCOUNTER — Encounter: Payer: Self-pay | Admitting: Behavioral Health

## 2023-03-29 ENCOUNTER — Other Ambulatory Visit (HOSPITAL_BASED_OUTPATIENT_CLINIC_OR_DEPARTMENT_OTHER): Payer: Self-pay

## 2023-03-29 DIAGNOSIS — F341 Dysthymic disorder: Secondary | ICD-10-CM

## 2023-03-29 DIAGNOSIS — F902 Attention-deficit hyperactivity disorder, combined type: Secondary | ICD-10-CM | POA: Diagnosis not present

## 2023-03-29 DIAGNOSIS — F411 Generalized anxiety disorder: Secondary | ICD-10-CM | POA: Diagnosis not present

## 2023-03-29 MED ORDER — METHYLPHENIDATE HCL ER (OSM) 36 MG PO TBCR
36.0000 mg | EXTENDED_RELEASE_TABLET | Freq: Every day | ORAL | 0 refills | Status: DC
  Filled 2023-03-29 – 2023-04-27 (×2): qty 30, 30d supply, fill #0

## 2023-03-29 NOTE — Progress Notes (Signed)
Benjamin [name] [id number] March 29, 1976 47 y.o.  Virtual Visit via Telephone Note  I connected with pt on 03/29/23 at  8:00 AM EDT by telephone and verified that I am speaking with the correct person using two identifiers.   I discussed the limitations, risks, security and privacy concerns of performing an evaluation and management service by telephone and the availability of in person appointments. I also discussed with the patient that there may be a patient responsible charge related to this service. The patient expressed understanding and agreed to proceed.   I discussed the assessment and treatment plan with the patient. The patient was provided an opportunity to ask questions and all were answered. The patient agreed with the plan and demonstrated an understanding of the instructions.   The patient was advised to call back or seek an in-person evaluation if the symptoms worsen or if the condition fails to improve as anticipated.  I provided 20  minutes of non-face-to-face time during this encounter.  The patient was located at home.  The provider was located at [facility].   Joan Flores, NP   Subjective:   Patient ID:  Benjamin [name] is a 47 y.o. (DOB [birth year]) male.  Chief Complaint: No chief complaint on file.   HPI  "[name]" , 47 year old male  is seen via telephonic interview for follow up and medication management.  Says that he is in a good place with medication, but says that he is not getting enough coverage with his Concerta 27 mg. He has been taking at 10 am to be able to just get to 4 pm. He would like to consider dosage increase this visit. Says his anxiety today is 2/10 and depression is 2/10. He is sleeping 7-8 hours per night.  He has strong family support and network of resources. Feel he is in a good place and feeling stable at this time.  He denies mania, no psychosis, no SI/HI. F/U in 4 weeks to reassess.   Prior psychiatric medications: Paxil  Review  of Systems:  Review of Systems  Constitutional: Negative.   Allergic/Immunologic: Negative.   Psychiatric/Behavioral: Negative.      Medications: I have reviewed the patient's current medications.  Current Outpatient Medications  Medication Sig Dispense Refill   methylphenidate (CONCERTA) 36 MG PO CR tablet Take 1 tablet (36 mg total) by mouth daily. 30 tablet 0   atorvastatin (LIPITOR) 80 MG tablet Take 80 mg by mouth daily at 6 PM.      azithromycin (ZITHROMAX) 250 MG tablet Take 2 tablets first day and then one tablet for the next four days Orally daily 5 days 6 tablet 0   buPROPion (WELLBUTRIN XL) 150 MG 24 hr tablet Take 1 tablet (150 mg total) by mouth every morning. 90 tablet 1   Continuous Blood Gluc Transmit (DEXCOM G6 TRANSMITTER) MISC See admin instructions.     HUMALOG 100 UNIT/ML injection Inject into the skin. Insulin pump.     insulin lispro (HUMALOG) 100 UNIT/ML injection USE UP TO 200 UNITS UNDER THE SKIN DAILY VIA INSULIN PUMP-patient needs Rx r/t waiting on mail order Pharmacy and needs to vials to pick up today 20 mL 0   losartan (COZAAR) 25 MG tablet Take 25 mg by mouth daily.     methylphenidate 27 MG PO CR tablet Take 1 tablet (27 mg total) by mouth every morning. 30 tablet 0   ofloxacin (OCUFLOX) 0.3 % ophthalmic solution Place 1 drop into the left  eye 4 (four) times daily, begin one day prior to surgery continue as directed 5 mL 5   prednisoLONE acetate (PRED FORTE) 1 % ophthalmic suspension Place 1 drop into the left eye 4 (four) times daily; begin one day after surgery continue as directed 10 mL 5   sildenafil (VIAGRA) 50 MG tablet Take 50 mg by mouth daily as needed for erectile dysfunction.     No current facility-administered medications for this visit.    Medication Side Effects: None  Allergies:  Allergies  Allergen Reactions   Bee Venom    Lisinopril     Other reaction(s): COUGH Other reaction(s): COUGH     Past Medical History:  Diagnosis Date    ADD (attention deficit disorder) 02/23/2018   Depression 02/23/2018   Diabetes type I (HCC)     No family history on file.  Social History   Socioeconomic History   Marital status: Married    Spouse name: Not on file   Number of children: Not on file   Years of education: Not on file   Highest education level: Not on file  Occupational History   Not on file  Tobacco Use   Smoking status: Never   Smokeless tobacco: Never  Vaping Use   Vaping status: Never Used  Substance and Sexual Activity   Alcohol use: Not Currently   Drug use: Not Currently    Types: Marijuana   Sexual activity: Yes  Other Topics Concern   Not on file  Social History Narrative   Not on file   Social Determinants of Health   Financial Resource Strain: Not on file  Food Insecurity: Not on file  Transportation Needs: Not on file  Physical Activity: Not on file  Stress: Not on file  Social Connections: Unknown (10/18/2021)   Received from [facility]   Social Network    Social Network: Not on file  Intimate Partner Violence: Unknown (09/08/2021)   Received from [facility]   HITS    Physically Hurt: Not on file    Insult or Talk Down To: Not on file    Threaten Physical Harm: Not on file    Scream or Curse: Not on file    Past Medical History, Surgical history, Social history, and Family history were reviewed and updated as appropriate.   Please see review of systems for further details on the patient's review from today.   Objective:   Physical Exam:  There were no vitals taken for this visit.  Physical Exam Constitutional:      General: He is not in acute distress.    Appearance: Normal appearance.  Neurological:     Mental Status: He is alert and oriented to person, place, and time.     Gait: Gait normal.  Psychiatric:        Attention and Perception: Attention and perception normal. He does not perceive auditory or visual hallucinations.        Mood and Affect: Mood and  affect normal. Mood is not anxious or depressed. Affect is not labile.        Speech: Speech normal.        Behavior: Behavior normal. Behavior is cooperative.        Thought Content: Thought content normal.        Cognition and Memory: Cognition and memory normal.        Judgment: Judgment normal.     Lab Review:     Component Value Date/Time   NA  138 06/02/2019 0211   K 3.5 06/02/2019 0211   CL 106 06/02/2019 0211   CO2 23 06/02/2019 0211   GLUCOSE 112 (H) 06/02/2019 0211   BUN 10 06/02/2019 0211   CREATININE 0.63 06/02/2019 0211   CALCIUM 8.1 (L) 06/02/2019 0211   PROT 5.2 (L) 06/03/2019 0139   ALBUMIN 2.1 (L) 06/03/2019 0139   AST 11 (L) 06/03/2019 0139   ALT 11 06/03/2019 0139   ALKPHOS 58 06/03/2019 0139   BILITOT 0.4 06/03/2019 0139   GFRNONAA >60 06/02/2019 0211   GFRAA >60 06/02/2019 0211       Component Value Date/Time   WBC 18.3 (H) 05/29/2019 1653   RBC 5.70 05/29/2019 1653   HGB 16.8 05/29/2019 1653   HCT 49.6 05/29/2019 1653   PLT 308 05/29/2019 1653   MCV 87.0 05/29/2019 1653   MCH 29.5 05/29/2019 1653   MCHC 33.9 05/29/2019 1653   RDW 12.2 05/29/2019 1653   LYMPHSABS 1.5 05/29/2019 1133   MONOABS 1.1 (H) 05/29/2019 1133   EOSABS 0.3 05/29/2019 1133   BASOSABS 0.1 05/29/2019 1133    No results found for: "POCLITH", "LITHIUM"   No results found for: "PHENYTOIN", "PHENOBARB", "VALPROATE", "CBMZ"   .res Assessment: Plan:    Greater than 50% of 30 min telephonic interview time with patient was spent on counseling and coordination of care. No social changes this visit. We is discussed his current level of stability.  Talked about his ADHD medication not providing enough coverage throughout the day. We talked about his concerns and agreed to increase his Concerta. He is currently receiving therapy and counseling with [name].  We agreed for him to increase  Concerta to 36 mg once daily. Will continue Wellbutrin 150 mg XL daily. Stopped Lexapro   Will report side effects or worsening symptoms promptly To follow up in 3 months to reassess. Discussed potential benefits, risks, and side effects of stimulants with patient to include increased heart rate, palpitations, insomnia, increased anxiety, increased irritability, or decreased appetite.  Instructed patient to contact office if experiencing any significant tolerability issues.  Reviewed PDMP    Joan Flores, NP  Diagnoses and all orders for this visit:  Attention deficit hyperactivity disorder (ADHD), combined type -     methylphenidate (CONCERTA) 36 MG PO CR tablet; Take 1 tablet (36 mg total) by mouth daily.  Persistent depressive disorder with atypical features, currently moderate  Generalized anxiety disorder    Please see After Visit Summary for patient specific instructions.  Future Appointments  Date Time Provider Department Center  08/01/2023 11:00 AM Lomax, Amy, NP [facility] None    No orders of the defined types were placed in this encounter.     -------------------------------

## 2023-04-02 DIAGNOSIS — S52125A Nondisplaced fracture of head of left radius, initial encounter for closed fracture: Secondary | ICD-10-CM | POA: Diagnosis not present

## 2023-04-02 DIAGNOSIS — M25522 Pain in left elbow: Secondary | ICD-10-CM | POA: Diagnosis not present

## 2023-04-04 DIAGNOSIS — F4325 Adjustment disorder with mixed disturbance of emotions and conduct: Secondary | ICD-10-CM | POA: Diagnosis not present

## 2023-04-05 DIAGNOSIS — Z794 Long term (current) use of insulin: Secondary | ICD-10-CM | POA: Diagnosis not present

## 2023-04-05 DIAGNOSIS — E1165 Type 2 diabetes mellitus with hyperglycemia: Secondary | ICD-10-CM | POA: Diagnosis not present

## 2023-04-05 DIAGNOSIS — E119 Type 2 diabetes mellitus without complications: Secondary | ICD-10-CM | POA: Diagnosis not present

## 2023-04-10 ENCOUNTER — Other Ambulatory Visit (HOSPITAL_BASED_OUTPATIENT_CLINIC_OR_DEPARTMENT_OTHER): Payer: Self-pay

## 2023-04-12 DIAGNOSIS — M25522 Pain in left elbow: Secondary | ICD-10-CM | POA: Diagnosis not present

## 2023-04-12 DIAGNOSIS — M25532 Pain in left wrist: Secondary | ICD-10-CM | POA: Diagnosis not present

## 2023-04-18 DIAGNOSIS — F4325 Adjustment disorder with mixed disturbance of emotions and conduct: Secondary | ICD-10-CM | POA: Diagnosis not present

## 2023-04-27 ENCOUNTER — Other Ambulatory Visit (HOSPITAL_BASED_OUTPATIENT_CLINIC_OR_DEPARTMENT_OTHER): Payer: Self-pay

## 2023-05-07 ENCOUNTER — Other Ambulatory Visit (HOSPITAL_BASED_OUTPATIENT_CLINIC_OR_DEPARTMENT_OTHER): Payer: Self-pay

## 2023-05-09 DIAGNOSIS — F4325 Adjustment disorder with mixed disturbance of emotions and conduct: Secondary | ICD-10-CM | POA: Diagnosis not present

## 2023-05-16 DIAGNOSIS — F4325 Adjustment disorder with mixed disturbance of emotions and conduct: Secondary | ICD-10-CM | POA: Diagnosis not present

## 2023-05-17 DIAGNOSIS — N182 Chronic kidney disease, stage 2 (mild): Secondary | ICD-10-CM | POA: Diagnosis not present

## 2023-05-17 DIAGNOSIS — E785 Hyperlipidemia, unspecified: Secondary | ICD-10-CM | POA: Diagnosis not present

## 2023-05-17 DIAGNOSIS — Z125 Encounter for screening for malignant neoplasm of prostate: Secondary | ICD-10-CM | POA: Diagnosis not present

## 2023-05-17 DIAGNOSIS — E1022 Type 1 diabetes mellitus with diabetic chronic kidney disease: Secondary | ICD-10-CM | POA: Diagnosis not present

## 2023-05-22 DIAGNOSIS — E119 Type 2 diabetes mellitus without complications: Secondary | ICD-10-CM | POA: Diagnosis not present

## 2023-05-22 DIAGNOSIS — Z794 Long term (current) use of insulin: Secondary | ICD-10-CM | POA: Diagnosis not present

## 2023-05-22 DIAGNOSIS — E1165 Type 2 diabetes mellitus with hyperglycemia: Secondary | ICD-10-CM | POA: Diagnosis not present

## 2023-05-23 DIAGNOSIS — F4325 Adjustment disorder with mixed disturbance of emotions and conduct: Secondary | ICD-10-CM | POA: Diagnosis not present

## 2023-05-24 DIAGNOSIS — Z1339 Encounter for screening examination for other mental health and behavioral disorders: Secondary | ICD-10-CM | POA: Diagnosis not present

## 2023-05-24 DIAGNOSIS — I10 Essential (primary) hypertension: Secondary | ICD-10-CM | POA: Diagnosis not present

## 2023-05-24 DIAGNOSIS — Z0000 Encounter for general adult medical examination without abnormal findings: Secondary | ICD-10-CM | POA: Diagnosis not present

## 2023-05-24 DIAGNOSIS — Z1331 Encounter for screening for depression: Secondary | ICD-10-CM | POA: Diagnosis not present

## 2023-05-24 DIAGNOSIS — E1022 Type 1 diabetes mellitus with diabetic chronic kidney disease: Secondary | ICD-10-CM | POA: Diagnosis not present

## 2023-06-07 DIAGNOSIS — I10 Essential (primary) hypertension: Secondary | ICD-10-CM | POA: Diagnosis not present

## 2023-06-07 DIAGNOSIS — E1022 Type 1 diabetes mellitus with diabetic chronic kidney disease: Secondary | ICD-10-CM | POA: Diagnosis not present

## 2023-06-13 DIAGNOSIS — F4325 Adjustment disorder with mixed disturbance of emotions and conduct: Secondary | ICD-10-CM | POA: Diagnosis not present

## 2023-06-27 DIAGNOSIS — F4325 Adjustment disorder with mixed disturbance of emotions and conduct: Secondary | ICD-10-CM | POA: Diagnosis not present

## 2023-07-04 DIAGNOSIS — F4325 Adjustment disorder with mixed disturbance of emotions and conduct: Secondary | ICD-10-CM | POA: Diagnosis not present

## 2023-07-10 DIAGNOSIS — H40013 Open angle with borderline findings, low risk, bilateral: Secondary | ICD-10-CM | POA: Diagnosis not present

## 2023-07-11 DIAGNOSIS — F4325 Adjustment disorder with mixed disturbance of emotions and conduct: Secondary | ICD-10-CM | POA: Diagnosis not present

## 2023-07-25 DIAGNOSIS — F4325 Adjustment disorder with mixed disturbance of emotions and conduct: Secondary | ICD-10-CM | POA: Diagnosis not present

## 2023-08-01 ENCOUNTER — Ambulatory Visit: Payer: BC Managed Care – PPO | Admitting: Family Medicine

## 2023-08-01 DIAGNOSIS — F4325 Adjustment disorder with mixed disturbance of emotions and conduct: Secondary | ICD-10-CM | POA: Diagnosis not present

## 2023-08-02 DIAGNOSIS — Z794 Long term (current) use of insulin: Secondary | ICD-10-CM | POA: Diagnosis not present

## 2023-08-02 DIAGNOSIS — E119 Type 2 diabetes mellitus without complications: Secondary | ICD-10-CM | POA: Diagnosis not present

## 2023-08-02 DIAGNOSIS — E1165 Type 2 diabetes mellitus with hyperglycemia: Secondary | ICD-10-CM | POA: Diagnosis not present

## 2023-08-08 DIAGNOSIS — F4325 Adjustment disorder with mixed disturbance of emotions and conduct: Secondary | ICD-10-CM | POA: Diagnosis not present

## 2023-08-15 DIAGNOSIS — F4325 Adjustment disorder with mixed disturbance of emotions and conduct: Secondary | ICD-10-CM | POA: Diagnosis not present

## 2023-08-17 ENCOUNTER — Other Ambulatory Visit: Payer: Self-pay

## 2023-08-17 ENCOUNTER — Other Ambulatory Visit (HOSPITAL_BASED_OUTPATIENT_CLINIC_OR_DEPARTMENT_OTHER): Payer: Self-pay

## 2023-08-17 ENCOUNTER — Telehealth: Payer: Self-pay | Admitting: Behavioral Health

## 2023-08-17 DIAGNOSIS — F341 Dysthymic disorder: Secondary | ICD-10-CM

## 2023-08-17 DIAGNOSIS — F411 Generalized anxiety disorder: Secondary | ICD-10-CM

## 2023-08-17 DIAGNOSIS — F902 Attention-deficit hyperactivity disorder, combined type: Secondary | ICD-10-CM

## 2023-08-17 MED ORDER — METHYLPHENIDATE HCL ER (OSM) 36 MG PO TBCR
36.0000 mg | EXTENDED_RELEASE_TABLET | Freq: Every day | ORAL | 0 refills | Status: DC
  Filled 2023-08-17: qty 30, 30d supply, fill #0

## 2023-08-17 MED ORDER — BUPROPION HCL ER (XL) 150 MG PO TB24: 150.0000 mg | ORAL_TABLET | Freq: Every morning | ORAL | 0 refills | Status: DC

## 2023-08-17 NOTE — Telephone Encounter (Signed)
 Sent Wellbutrin to [facility], pended methylphenidate to [facility].

## 2023-08-17 NOTE — Telephone Encounter (Signed)
 Benjamin [name] returned call at 10:25 to make follow up appt so his refills can be processed.  He is scheduled for 09/05/23

## 2023-08-22 DIAGNOSIS — F4325 Adjustment disorder with mixed disturbance of emotions and conduct: Secondary | ICD-10-CM | POA: Diagnosis not present

## 2023-08-27 ENCOUNTER — Other Ambulatory Visit (HOSPITAL_BASED_OUTPATIENT_CLINIC_OR_DEPARTMENT_OTHER): Payer: Self-pay

## 2023-08-29 DIAGNOSIS — F4325 Adjustment disorder with mixed disturbance of emotions and conduct: Secondary | ICD-10-CM | POA: Diagnosis not present

## 2023-09-05 ENCOUNTER — Telehealth: Payer: Self-pay | Admitting: Behavioral Health

## 2023-09-05 ENCOUNTER — Other Ambulatory Visit: Payer: Self-pay | Admitting: Behavioral Health

## 2023-09-05 ENCOUNTER — Other Ambulatory Visit (HOSPITAL_BASED_OUTPATIENT_CLINIC_OR_DEPARTMENT_OTHER): Payer: Self-pay

## 2023-09-05 ENCOUNTER — Ambulatory Visit: Admitting: Behavioral Health

## 2023-09-05 DIAGNOSIS — F4325 Adjustment disorder with mixed disturbance of emotions and conduct: Secondary | ICD-10-CM | POA: Diagnosis not present

## 2023-09-05 DIAGNOSIS — Z0389 Encounter for observation for other suspected diseases and conditions ruled out: Secondary | ICD-10-CM

## 2023-09-05 DIAGNOSIS — F902 Attention-deficit hyperactivity disorder, combined type: Secondary | ICD-10-CM

## 2023-09-05 MED ORDER — METHYLPHENIDATE HCL ER (OSM) 36 MG PO TBCR
36.0000 mg | EXTENDED_RELEASE_TABLET | Freq: Every day | ORAL | 0 refills | Status: DC
  Filled 2023-09-05: qty 30, 30d supply, fill #0

## 2023-09-05 NOTE — Progress Notes (Signed)
 Was unable to reach patient during scheduled time. Called provided number and then waited 5 minutes to call again. Straight to VM. Did not leave message. Unsure of consent.  No charge this time.

## 2023-09-05 NOTE — Telephone Encounter (Signed)
 Benjamin [name] called at 11:08 stating that he never got his call this morning for his appt.  After verifying his phone number I noted that the # given to call was the wrong number.  His number listed in his profile is correct,, but the number in the notes for the appt was off one digit. So if you tried to call that number, it would not have been correct.  That is our fault not his.  I have rescheduled him for 4/9.  BUT he needs a refill of his Concerta now.  All other medications are fine.  Please send refill for the Concerta to   [facility]

## 2023-09-05 NOTE — Telephone Encounter (Signed)
 Yes, I tried to call the number provided twice 5 minutes apart.

## 2023-09-06 NOTE — Telephone Encounter (Signed)
 Yes, made sure NC1 on chart

## 2023-09-12 ENCOUNTER — Other Ambulatory Visit (HOSPITAL_BASED_OUTPATIENT_CLINIC_OR_DEPARTMENT_OTHER): Payer: Self-pay

## 2023-09-12 ENCOUNTER — Ambulatory Visit: Admitting: Behavioral Health

## 2023-09-12 ENCOUNTER — Encounter: Payer: Self-pay | Admitting: Behavioral Health

## 2023-09-12 DIAGNOSIS — F411 Generalized anxiety disorder: Secondary | ICD-10-CM | POA: Diagnosis not present

## 2023-09-12 DIAGNOSIS — E1022 Type 1 diabetes mellitus with diabetic chronic kidney disease: Secondary | ICD-10-CM | POA: Diagnosis not present

## 2023-09-12 DIAGNOSIS — F32A Depression, unspecified: Secondary | ICD-10-CM

## 2023-09-12 DIAGNOSIS — F902 Attention-deficit hyperactivity disorder, combined type: Secondary | ICD-10-CM

## 2023-09-12 DIAGNOSIS — J0100 Acute maxillary sinusitis, unspecified: Secondary | ICD-10-CM | POA: Diagnosis not present

## 2023-09-12 MED ORDER — BENZONATATE 200 MG PO CAPS
200.0000 mg | ORAL_CAPSULE | Freq: Three times a day (TID) | ORAL | 0 refills | Status: DC
  Filled 2023-09-12: qty 30, 10d supply, fill #0

## 2023-09-12 MED ORDER — AMOXICILLIN-POT CLAVULANATE 875-125 MG PO TABS
1.0000 | ORAL_TABLET | Freq: Two times a day (BID) | ORAL | 0 refills | Status: AC
  Filled 2023-09-12: qty 20, 10d supply, fill #0

## 2023-09-12 MED ORDER — METHYLPHENIDATE HCL ER (OSM) 36 MG PO TBCR: 36.0000 mg | EXTENDED_RELEASE_TABLET | Freq: Every day | ORAL | 0 refills | Status: DC

## 2023-09-12 NOTE — Progress Notes (Signed)
 Benjamin [name] [id number] December 24, 1975 48 y.o.  Virtual Visit via Telephone Note  I connected with pt on 09/12/23 at  8:30 AM EDT by telephone and verified that I am speaking with the correct person using two identifiers.   I discussed the limitations, risks, security and privacy concerns of performing an evaluation and management service by telephone and the availability of in person appointments. I also discussed with the patient that there may be a patient responsible charge related to this service. The patient expressed understanding and agreed to proceed.   I discussed the assessment and treatment plan with the patient. The patient was provided an opportunity to ask questions and all were answered. The patient agreed with the plan and demonstrated an understanding of the instructions.   The patient was advised to call back or seek an in-person evaluation if the symptoms worsen or if the condition fails to improve as anticipated.  I provided 20 minutes of non-face-to-face time during this encounter.  The patient was located at home.  The provider was located at [facility].   Joan Flores, NP   Subjective:   Patient ID:  Benjamin [name] is a 48 y.o. (DOB 1976/03/12) male.  Chief Complaint:  Chief Complaint  Patient presents with   Anxiety   ADHD   Depression   Medication Problem   Patient Education   Medication Refill    HPI "Benjamin [name]" , 48 year old male  is seen via telephonic interview for follow up and medication management.  Says that he is in a good place with medication. He say, "I'm a little under the weather right now with sinus infection. Requesting no med changes today. Says his anxiety today is 2/10 and depression is 2/10. He is sleeping 7-8 hours per night.  He has strong family support and network of resources. Feel he is in a good place and feeling stable at this time.  He denies mania, no psychosis, no SI/HI.    Prior psychiatric  medications: Paxil   Review of Systems:  Review of Systems  Constitutional: Negative.   Allergic/Immunologic: Negative.   Psychiatric/Behavioral: Negative.      Medications: I have reviewed the patient's current medications.  Current Outpatient Medications  Medication Sig Dispense Refill   atorvastatin (LIPITOR) 80 MG tablet Take 80 mg by mouth daily at 6 PM.      azithromycin (ZITHROMAX) 250 MG tablet Take 2 tablets first day and then one tablet for the next four days Orally daily 5 days 6 tablet 0   buPROPion (WELLBUTRIN XL) 150 MG 24 hr tablet Take 1 tablet (150 mg total) by mouth every morning. 90 tablet 0   Continuous Blood Gluc Transmit (DEXCOM G6 TRANSMITTER) MISC See admin instructions.     HUMALOG 100 UNIT/ML injection Inject into the skin. Insulin pump.     insulin lispro (HUMALOG) 100 UNIT/ML injection USE UP TO 200 UNITS UNDER THE SKIN DAILY VIA INSULIN PUMP-patient needs Rx r/t waiting on mail order Pharmacy and needs to vials to pick up today 20 mL 0   losartan (COZAAR) 25 MG tablet Take 25 mg by mouth daily.     [START ON 10/03/2023] methylphenidate (CONCERTA) 36 MG PO CR tablet Take 1 tablet (36 mg total) by mouth daily. 30 tablet 0   ofloxacin (OCUFLOX) 0.3 % ophthalmic solution Place 1 drop into the left eye 4 (four) times daily, begin one day prior to surgery continue as directed 5 mL 5   prednisoLONE acetate (  PRED FORTE) 1 % ophthalmic suspension Place 1 drop into the left eye 4 (four) times daily; begin one day after surgery continue as directed 10 mL 5   sildenafil (VIAGRA) 50 MG tablet Take 50 mg by mouth daily as needed for erectile dysfunction.     No current facility-administered medications for this visit.    Medication Side Effects: None  Allergies:  Allergies  Allergen Reactions   Bee Venom    Lisinopril     Other reaction(s): COUGH Other reaction(s): COUGH     Past Medical History:  Diagnosis Date   ADD (attention deficit disorder) 02/23/2018    Depression 02/23/2018   Diabetes type I (HCC)     History reviewed. No pertinent family history.  Social History   Socioeconomic History   Marital status: Married    Spouse name: Not on file   Number of children: Not on file   Years of education: Not on file   Highest education level: Not on file  Occupational History   Not on file  Tobacco Use   Smoking status: Never   Smokeless tobacco: Never  Vaping Use   Vaping status: Never Used  Substance and Sexual Activity   Alcohol use: Not Currently   Drug use: Not Currently    Types: Marijuana   Sexual activity: Yes  Other Topics Concern   Not on file  Social History Narrative   Not on file   Social Drivers of Health   Financial Resource Strain: Not on file  Food Insecurity: Not on file  Transportation Needs: Not on file  Physical Activity: Not on file  Stress: Not on file  Social Connections: Unknown (10/18/2021)   Received from [facility]   Social Network    Social Network: Not on file  Intimate Partner Violence: Unknown (09/08/2021)   Received from [facility]   HITS    Physically Hurt: Not on file    Insult or Talk Down To: Not on file    Threaten Physical Harm: Not on file    Scream or Curse: Not on file    Past Medical History, Surgical history, Social history, and Family history were reviewed and updated as appropriate.   Please see review of systems for further details on the patient's review from today.   Objective:   Physical Exam:  There were no vitals taken for this visit.  Physical Exam Constitutional:      General: He is not in acute distress.    Appearance: Normal appearance.  Neurological:     Mental Status: He is alert and oriented to person, place, and time.     Gait: Gait normal.  Psychiatric:        Attention and Perception: Attention and perception normal. He does not perceive auditory or visual hallucinations.        Mood and Affect: Mood and affect normal. Mood is not anxious or  depressed. Affect is not labile.        Speech: Speech normal.        Behavior: Behavior normal. Behavior is cooperative.        Thought Content: Thought content normal.        Cognition and Memory: Cognition and memory normal.        Judgment: Judgment normal.     Lab Review:     Component Value Date/Time   NA 138 06/02/2019 0211   K 3.5 06/02/2019 0211   CL 106 06/02/2019 0211   CO2 23 06/02/2019  0211   GLUCOSE 112 (H) 06/02/2019 0211   BUN 10 06/02/2019 0211   CREATININE 0.63 06/02/2019 0211   CALCIUM 8.1 (L) 06/02/2019 0211   PROT 5.2 (L) 06/03/2019 0139   ALBUMIN 2.1 (L) 06/03/2019 0139   AST 11 (L) 06/03/2019 0139   ALT 11 06/03/2019 0139   ALKPHOS 58 06/03/2019 0139   BILITOT 0.4 06/03/2019 0139   GFRNONAA >60 06/02/2019 0211   GFRAA >60 06/02/2019 0211       Component Value Date/Time   WBC 18.3 (H) 05/29/2019 1653   RBC 5.70 05/29/2019 1653   HGB 16.8 05/29/2019 1653   HCT 49.6 05/29/2019 1653   PLT 308 05/29/2019 1653   MCV 87.0 05/29/2019 1653   MCH 29.5 05/29/2019 1653   MCHC 33.9 05/29/2019 1653   RDW 12.2 05/29/2019 1653   LYMPHSABS 1.5 05/29/2019 1133   MONOABS 1.1 (H) 05/29/2019 1133   EOSABS 0.3 05/29/2019 1133   BASOSABS 0.1 05/29/2019 1133    No results found for: "POCLITH", "LITHIUM"   No results found for: "PHENYTOIN", "PHENOBARB", "VALPROATE", "CBMZ"   .res Assessment: Plan:    Greater than 50% of 20  min telephonic interview time with patient was spent on counseling and coordination of care. No social changes this visit. We is discussed his current level of stability.  Concerta is providing effective coverage.  No indication for med changes this visit.  He is currently receiving therapy and counseling with [name].  We agreed for him to increase  Concerta to 36 mg once daily. Will continue Wellbutrin 150 mg XL daily. Stopped Lexapro  Will report side effects or worsening symptoms promptly To follow up in 3 months to  reassess. Discussed potential benefits, risks, and side effects of stimulants with patient to include increased heart rate, palpitations, insomnia, increased anxiety, increased irritability, or decreased appetite.  Instructed patient to contact office if experiencing any significant tolerability issues.  Reviewed PDMP   Benjamin "Benjamin [name]" was seen today for anxiety, adhd, depression, medication problem, patient education and medication refill.  Diagnoses and all orders for this visit:  Generalized anxiety disorder  Attention deficit hyperactivity disorder (ADHD), combined type -     methylphenidate (CONCERTA) 36 MG PO CR tablet; Take 1 tablet (36 mg total) by mouth daily.  Mild depression    Please see After Visit Summary for patient specific instructions.  Future Appointments  Date Time Provider Department Center  01/24/2024  2:00 PM Lomax, Amy, NP [facility] None    No orders of the defined types were placed in this encounter.     -------------------------------

## 2023-09-14 DIAGNOSIS — E1022 Type 1 diabetes mellitus with diabetic chronic kidney disease: Secondary | ICD-10-CM | POA: Diagnosis not present

## 2023-09-19 DIAGNOSIS — F4325 Adjustment disorder with mixed disturbance of emotions and conduct: Secondary | ICD-10-CM | POA: Diagnosis not present

## 2023-09-26 DIAGNOSIS — F4325 Adjustment disorder with mixed disturbance of emotions and conduct: Secondary | ICD-10-CM | POA: Diagnosis not present

## 2023-10-03 DIAGNOSIS — F4325 Adjustment disorder with mixed disturbance of emotions and conduct: Secondary | ICD-10-CM | POA: Diagnosis not present

## 2023-10-10 DIAGNOSIS — F4325 Adjustment disorder with mixed disturbance of emotions and conduct: Secondary | ICD-10-CM | POA: Diagnosis not present

## 2023-10-17 DIAGNOSIS — F4325 Adjustment disorder with mixed disturbance of emotions and conduct: Secondary | ICD-10-CM | POA: Diagnosis not present

## 2023-10-24 DIAGNOSIS — F4325 Adjustment disorder with mixed disturbance of emotions and conduct: Secondary | ICD-10-CM | POA: Diagnosis not present

## 2023-10-31 DIAGNOSIS — Z794 Long term (current) use of insulin: Secondary | ICD-10-CM | POA: Diagnosis not present

## 2023-10-31 DIAGNOSIS — E1165 Type 2 diabetes mellitus with hyperglycemia: Secondary | ICD-10-CM | POA: Diagnosis not present

## 2023-10-31 DIAGNOSIS — E119 Type 2 diabetes mellitus without complications: Secondary | ICD-10-CM | POA: Diagnosis not present

## 2023-10-31 DIAGNOSIS — F4325 Adjustment disorder with mixed disturbance of emotions and conduct: Secondary | ICD-10-CM | POA: Diagnosis not present

## 2023-11-07 DIAGNOSIS — F4325 Adjustment disorder with mixed disturbance of emotions and conduct: Secondary | ICD-10-CM | POA: Diagnosis not present

## 2023-11-14 DIAGNOSIS — F4325 Adjustment disorder with mixed disturbance of emotions and conduct: Secondary | ICD-10-CM | POA: Diagnosis not present

## 2023-11-15 DIAGNOSIS — E113311 Type 2 diabetes mellitus with moderate nonproliferative diabetic retinopathy with macular edema, right eye: Secondary | ICD-10-CM | POA: Diagnosis not present

## 2023-11-15 DIAGNOSIS — H3582 Retinal ischemia: Secondary | ICD-10-CM | POA: Diagnosis not present

## 2023-11-15 DIAGNOSIS — H43811 Vitreous degeneration, right eye: Secondary | ICD-10-CM | POA: Diagnosis not present

## 2023-11-15 DIAGNOSIS — E113512 Type 2 diabetes mellitus with proliferative diabetic retinopathy with macular edema, left eye: Secondary | ICD-10-CM | POA: Diagnosis not present

## 2023-11-28 DIAGNOSIS — F4325 Adjustment disorder with mixed disturbance of emotions and conduct: Secondary | ICD-10-CM | POA: Diagnosis not present

## 2023-12-05 DIAGNOSIS — F4325 Adjustment disorder with mixed disturbance of emotions and conduct: Secondary | ICD-10-CM | POA: Diagnosis not present

## 2023-12-12 DIAGNOSIS — F4325 Adjustment disorder with mixed disturbance of emotions and conduct: Secondary | ICD-10-CM | POA: Diagnosis not present

## 2023-12-14 ENCOUNTER — Other Ambulatory Visit (HOSPITAL_BASED_OUTPATIENT_CLINIC_OR_DEPARTMENT_OTHER): Payer: Self-pay

## 2023-12-14 ENCOUNTER — Encounter: Payer: Self-pay | Admitting: Behavioral Health

## 2023-12-14 ENCOUNTER — Ambulatory Visit: Admitting: Behavioral Health

## 2023-12-14 DIAGNOSIS — F341 Dysthymic disorder: Secondary | ICD-10-CM

## 2023-12-14 DIAGNOSIS — F411 Generalized anxiety disorder: Secondary | ICD-10-CM

## 2023-12-14 DIAGNOSIS — F902 Attention-deficit hyperactivity disorder, combined type: Secondary | ICD-10-CM | POA: Diagnosis not present

## 2023-12-14 MED ORDER — METHYLPHENIDATE HCL ER (OSM) 36 MG PO TBCR
36.0000 mg | EXTENDED_RELEASE_TABLET | Freq: Every day | ORAL | 0 refills | Status: AC
  Filled 2023-12-14: qty 30, 30d supply, fill #0

## 2023-12-14 MED ORDER — BUPROPION HCL ER (XL) 150 MG PO TB24
150.0000 mg | ORAL_TABLET | Freq: Every morning | ORAL | 1 refills | Status: AC
  Filled 2023-12-14: qty 30, 30d supply, fill #0

## 2023-12-14 NOTE — Progress Notes (Signed)
 Benjamin [name] [id number] 1976/05/19 48 y.o.  Virtual Visit via Telephone Note  I connected with pt on 12/14/23 at  8:00 AM EDT by telephone and verified that I am speaking with the correct person using two identifiers.   I discussed the limitations, risks, security and privacy concerns of performing an evaluation and management service by telephone and the availability of in person appointments. I also discussed with the patient that there may be a patient responsible charge related to this service. The patient expressed understanding and agreed to proceed.   I discussed the assessment and treatment plan with the patient. The patient was provided an opportunity to ask questions and all were answered. The patient agreed with the plan and demonstrated an understanding of the instructions.   The patient was advised to call back or seek an in-person evaluation if the symptoms worsen or if the condition fails to improve as anticipated.  I provided 20 minutes of non-face-to-face time during this encounter.  The patient was located at home.  The provider was located at [facility].   Benjamin DELENA Pizza, NP   Subjective:   Patient ID:  Benjamin [name] is a 48 y.o. (DOB 16-Nov-1975) male.  Chief Complaint: No chief complaint on file.   HPI [place] , 48 year old male  is seen via telephonic interview for follow up and medication management.  Says that he is in a good place with medication. He says, he has been doing very well with his current med regimen. Requesting no med changes today. Attention and focus for work is good. Appetite good. Says his anxiety today is 2/10 and depression is 2/10. He is sleeping 7-8 hours per night.  He has strong family support and network of resources. He denies mania, no psychosis, no SI/HI.    Prior psychiatric medications: Paxil     Review of Systems:  Review of Systems  Constitutional: Negative.   Allergic/Immunologic: Negative.   Neurological:  Negative.   Psychiatric/Behavioral:  Positive for dysphoric mood.     Medications: I have reviewed the patient's current medications.  Current Outpatient Medications  Medication Sig Dispense Refill   atorvastatin (LIPITOR) 80 MG tablet Take 80 mg by mouth daily at 6 PM.      azithromycin  (ZITHROMAX ) 250 MG tablet Take 2 tablets first day and then one tablet for the next four days Orally daily 5 days 6 tablet 0   benzonatate  (TESSALON ) 200 MG capsule Take 1 capsule (200 mg total) by mouth 3 (three) times daily.\ for cough. 30 capsule 0   buPROPion  (WELLBUTRIN  XL) 150 MG 24 hr tablet Take 1 tablet (150 mg total) by mouth every morning. 90 tablet 1   Continuous Blood Gluc Transmit (DEXCOM G6 TRANSMITTER) MISC See admin instructions.     HUMALOG  100 UNIT/ML injection Inject into the skin. Insulin  pump.     insulin  lispro (HUMALOG ) 100 UNIT/ML injection USE UP TO 200 UNITS UNDER THE SKIN DAILY VIA INSULIN  PUMP-patient needs Rx r/t waiting on mail order Pharmacy and needs to vials to pick up today 20 mL 0   losartan (COZAAR) 25 MG tablet Take 25 mg by mouth daily.     methylphenidate  (CONCERTA ) 36 MG PO CR tablet Take 1 tablet (36 mg total) by mouth daily. 30 tablet 0   ofloxacin  (OCUFLOX ) 0.3 % ophthalmic solution Place 1 drop into the left eye 4 (four) times daily, begin one day prior to surgery continue as directed 5 mL 5   prednisoLONE   acetate (PRED FORTE ) 1 % ophthalmic suspension Place 1 drop into the left eye 4 (four) times daily; begin one day after surgery continue as directed 10 mL 5   sildenafil (VIAGRA) 50 MG tablet Take 50 mg by mouth daily as needed for erectile dysfunction.     No current facility-administered medications for this visit.    Medication Side Effects: None  Allergies:  Allergies  Allergen Reactions   Bee Venom    Lisinopril      Other reaction(s): COUGH Other reaction(s): COUGH     Past Medical History:  Diagnosis Date   ADD (attention deficit disorder)  02/23/2018   Depression 02/23/2018   Diabetes type I (HCC)     History reviewed. No pertinent family history.  Social History   Socioeconomic History   Marital status: Married    Spouse name: Not on file   Number of children: Not on file   Years of education: Not on file   Highest education level: Not on file  Occupational History   Not on file  Tobacco Use   Smoking status: Never   Smokeless tobacco: Never  Vaping Use   Vaping status: Never Used  Substance and Sexual Activity   Alcohol  use: Not Currently   Drug use: Not Currently    Types: Marijuana   Sexual activity: Yes  Other Topics Concern   Not on file  Social History Narrative   Not on file   Social Drivers of Health   Financial Resource Strain: Not on file  Food Insecurity: Not on file  Transportation Needs: Not on file  Physical Activity: Not on file  Stress: Not on file  Social Connections: Unknown (10/18/2021)   Received from [facility]   Social Network    Social Network: Not on file  Intimate Partner Violence: Unknown (09/08/2021)   Received from [facility]   HITS    Physically Hurt: Not on file    Insult or Talk Down To: Not on file    Threaten Physical Harm: Not on file    Scream or Curse: Not on file    Past Medical History, Surgical history, Social history, and Family history were reviewed and updated as appropriate.   Please see review of systems for further details on the patient's review from today.   Objective:   Physical Exam:  There were no vitals taken for this visit.  Physical Exam Constitutional:      General: He is not in acute distress.    Appearance: Normal appearance.  Neurological:     Mental Status: He is alert and oriented to person, place, and time.     Gait: Gait normal.  Psychiatric:        Attention and Perception: Attention and perception normal. He does not perceive auditory or visual hallucinations.        Mood and Affect: Mood and affect normal. Mood is not  anxious or depressed. Affect is not labile.        Speech: Speech normal.        Behavior: Behavior normal. Behavior is cooperative.        Thought Content: Thought content normal.        Cognition and Memory: Cognition and memory normal.        Judgment: Judgment normal.     Lab Review:     Component Value Date/Time   NA 138 06/02/2019 0211   K 3.5 06/02/2019 0211   CL 106 06/02/2019 0211   CO2 23  06/02/2019 0211   GLUCOSE 112 (H) 06/02/2019 0211   BUN 10 06/02/2019 0211   CREATININE 0.63 06/02/2019 0211   CALCIUM 8.1 (L) 06/02/2019 0211   PROT 5.2 (L) 06/03/2019 0139   ALBUMIN 2.1 (L) 06/03/2019 0139   AST 11 (L) 06/03/2019 0139   ALT 11 06/03/2019 0139   ALKPHOS 58 06/03/2019 0139   BILITOT 0.4 06/03/2019 0139   GFRNONAA >60 06/02/2019 0211   GFRAA >60 06/02/2019 0211       Component Value Date/Time   WBC 18.3 (H) 05/29/2019 1653   RBC 5.70 05/29/2019 1653   HGB 16.8 05/29/2019 1653   HCT 49.6 05/29/2019 1653   PLT 308 05/29/2019 1653   MCV 87.0 05/29/2019 1653   MCH 29.5 05/29/2019 1653   MCHC 33.9 05/29/2019 1653   RDW 12.2 05/29/2019 1653   LYMPHSABS 1.5 05/29/2019 1133   MONOABS 1.1 (H) 05/29/2019 1133   EOSABS 0.3 05/29/2019 1133   BASOSABS 0.1 05/29/2019 1133    No results found for: POCLITH, LITHIUM   No results found for: PHENYTOIN, PHENOBARB, VALPROATE, CBMZ   .res Assessment: Plan:    Greater than 50% of 20  min telephonic interview time with patient was spent on counseling and coordination of care. No social changes this visit. We is discussed his current level of stability.  Concerta  is providing effective coverage.  No indication for med changes this visit.  He is currently receiving therapy and counseling with Alm Duran.  We agreed: To continue Concerta  to 36 mg once daily. Will continue Wellbutrin  150 mg XL daily. Will report side effects or worsening symptoms promptly To follow up in 3 months to reassess. Discussed potential  benefits, risks, and side effects of stimulants with patient to include increased heart rate, palpitations, insomnia, increased anxiety, increased irritability, or decreased appetite.  Instructed patient to contact office if experiencing any significant tolerability issues.  Reviewed PDMP   Diagnoses and all orders for this visit:  Generalized anxiety disorder -     buPROPion  (WELLBUTRIN  XL) 150 MG 24 hr tablet; Take 1 tablet (150 mg total) by mouth every morning.  Persistent depressive disorder with atypical features, currently moderate -     buPROPion  (WELLBUTRIN  XL) 150 MG 24 hr tablet; Take 1 tablet (150 mg total) by mouth every morning.  Attention deficit hyperactivity disorder (ADHD), combined type -     methylphenidate  (CONCERTA ) 36 MG PO CR tablet; Take 1 tablet (36 mg total) by mouth daily.    Please see After Visit Summary for patient specific instructions.  Future Appointments  Date Time Provider Department Center  01/24/2024  2:00 PM Lomax, Amy, NP [facility] None    No orders of the defined types were placed in this encounter.     -------------------------------

## 2023-12-18 ENCOUNTER — Other Ambulatory Visit (HOSPITAL_BASED_OUTPATIENT_CLINIC_OR_DEPARTMENT_OTHER): Payer: Self-pay

## 2023-12-19 DIAGNOSIS — F4325 Adjustment disorder with mixed disturbance of emotions and conduct: Secondary | ICD-10-CM | POA: Diagnosis not present

## 2024-01-03 ENCOUNTER — Other Ambulatory Visit: Payer: Self-pay | Admitting: Behavioral Health

## 2024-01-03 DIAGNOSIS — F341 Dysthymic disorder: Secondary | ICD-10-CM

## 2024-01-03 DIAGNOSIS — F411 Generalized anxiety disorder: Secondary | ICD-10-CM

## 2024-01-09 DIAGNOSIS — F4325 Adjustment disorder with mixed disturbance of emotions and conduct: Secondary | ICD-10-CM | POA: Diagnosis not present

## 2024-01-17 DIAGNOSIS — H40013 Open angle with borderline findings, low risk, bilateral: Secondary | ICD-10-CM | POA: Diagnosis not present

## 2024-01-22 DIAGNOSIS — E1022 Type 1 diabetes mellitus with diabetic chronic kidney disease: Secondary | ICD-10-CM | POA: Diagnosis not present

## 2024-01-22 DIAGNOSIS — I10 Essential (primary) hypertension: Secondary | ICD-10-CM | POA: Diagnosis not present

## 2024-01-23 ENCOUNTER — Telehealth: Payer: Self-pay

## 2024-01-23 DIAGNOSIS — F4325 Adjustment disorder with mixed disturbance of emotions and conduct: Secondary | ICD-10-CM | POA: Diagnosis not present

## 2024-01-23 NOTE — Progress Notes (Unsigned)
 PATIENT: Benjamin [name] DOB: 1975/11/27  REASON FOR VISIT: follow up HISTORY FROM: patient  No chief complaint on file.    HISTORY OF PRESENT ILLNESS:  01/23/24 ALL:  Benjamin [name] returns for follow up for OSA on CPAP.   07/31/2022 ALL:  Benjamin [name] is a 48 y.o. male here today for follow up for OSA on CPAP.  He continues to do well. He is using CPAP night for about 6 hours. He denies concerns with machine or supplies.He has noted dry mouth despite maximum humidity settings. He occasionally notes a leak in mask. He has facial hair. Using FFM. He does need to replace mask.   He is followed by [employer] [facility], psychiatry, with [facility]. He continues methylphenidate  CR 27mg  daily.     HISTORY: (copied from Dr Dohmeier's previous note)  Benjamin [name] is a 79 - year- old Caucasian male patient and is seen on 07/27/2021 . RV with new CPAP:  Benjamin [name] confirms that he feels he sleeps better than before CPAP was implemented, he is generally happy with his new CPAP which is a rest vent machine.  His Epworth Sleepiness Scale has been reduced to 9 points, he does not report excessive fatigue.  His compliance is excellent so he is using the I breeze 28 device with the serial number [id number] be [id number] for the last 8 months and 4 days now and his 95th percentile pressure in CPAP is 8.8 cm water.  His residual AHI is 2.9/h which is excellent he has no central apneas arising.  Occasionally he may still snore through the mask this is reflected in the rare average 4.5 per night.  He does have mediocre air symptoms actually his air leaks have increased since 11 February and that is when he is started using a new mask, same bottle supposedly same size but mask and headgear here refreshed at the time and I do wonder if it is not set to his best advantage.  The average pressure is 8.8 cm water and he has been compliant at 93% 4 hours and days which is an excellent compliance. He works out 3-5 days a week, but had gained  weight until December.   Travel CPAP and inspire device were discussed. He is not qualified by [facility] I think he will do better with a travel CPAP. Its personal travel , not job related. He may entertain a dental device   REVIEW OF SYSTEMS: Out of a complete 14 system review of symptoms, the patient complains only of the following symptoms, dry mouth, and all other reviewed systems are negative.  ESS: 10/24 FSS: 24/63  ALLERGIES: Allergies  Allergen Reactions   Bee Venom    Lisinopril      Other reaction(s): COUGH Other reaction(s): COUGH     HOME MEDICATIONS: Outpatient Medications Prior to Visit  Medication Sig Dispense Refill   atorvastatin (LIPITOR) 80 MG tablet Take 80 mg by mouth daily at 6 PM.      azithromycin  (ZITHROMAX ) 250 MG tablet Take 2 tablets first day and then one tablet for the next four days Orally daily 5 days 6 tablet 0   benzonatate  (TESSALON ) 200 MG capsule Take 1 capsule (200 mg total) by mouth 3 (three) times daily.\ for cough. 30 capsule 0   buPROPion  (WELLBUTRIN  XL) 150 MG 24 hr tablet Take 1 tablet (150 mg total) by mouth every morning. 90 tablet 1   Continuous Blood Gluc Transmit (DEXCOM G6 TRANSMITTER) MISC See admin instructions.  HUMALOG  100 UNIT/ML injection Inject into the skin. Insulin  pump.     insulin  lispro (HUMALOG ) 100 UNIT/ML injection USE UP TO 200 UNITS UNDER THE SKIN DAILY VIA INSULIN  PUMP-patient needs Rx r/t waiting on mail order Pharmacy and needs to vials to pick up today 20 mL 0   losartan (COZAAR) 25 MG tablet Take 25 mg by mouth daily.     methylphenidate  (CONCERTA ) 36 MG PO CR tablet Take 1 tablet (36 mg total) by mouth daily. 30 tablet 0   ofloxacin  (OCUFLOX ) 0.3 % ophthalmic solution Place 1 drop into the left eye 4 (four) times daily, begin one day prior to surgery continue as directed 5 mL 5   prednisoLONE  acetate (PRED FORTE ) 1 % ophthalmic suspension Place 1 drop into the left eye 4 (four) times daily; begin one day after  surgery continue as directed 10 mL 5   sildenafil (VIAGRA) 50 MG tablet Take 50 mg by mouth daily as needed for erectile dysfunction.     No facility-administered medications prior to visit.    PAST MEDICAL HISTORY: Past Medical History:  Diagnosis Date   ADD (attention deficit disorder) 02/23/2018   Depression 02/23/2018   Diabetes type I (HCC)     PAST SURGICAL HISTORY: No past surgical history on file.  FAMILY HISTORY: No family history on file.  SOCIAL HISTORY: Social History   Socioeconomic History   Marital status: Married    Spouse name: Not on file   Number of children: Not on file   Years of education: Not on file   Highest education level: Not on file  Occupational History   Not on file  Tobacco Use   Smoking status: Never   Smokeless tobacco: Never  Vaping Use   Vaping status: Never Used  Substance and Sexual Activity   Alcohol  use: Not Currently   Drug use: Not Currently    Types: Marijuana   Sexual activity: Yes  Other Topics Concern   Not on file  Social History Narrative   Not on file   Social Drivers of Health   Financial Resource Strain: Not on file  Food Insecurity: Not on file  Transportation Needs: Not on file  Physical Activity: Not on file  Stress: Not on file  Social Connections: Unknown (10/18/2021)   Received from [facility]   Social Network    Social Network: Not on file  Intimate Partner Violence: Unknown (09/08/2021)   Received from [facility]   HITS    Physically Hurt: Not on file    Insult or Talk Down To: Not on file    Threaten Physical Harm: Not on file    Scream or Curse: Not on file     PHYSICAL EXAM  There were no vitals filed for this visit.  There is no height or weight on file to calculate BMI.  Generalized: Well developed, in no acute distress  Cardiology: normal rate and rhythm, no murmur noted Respiratory: clear to auscultation bilaterally  Neurological examination  Mentation: Alert oriented to  time, place, history taking. Follows all commands speech and language fluent Cranial nerve II-XII: Pupils were equal round reactive to light. Extraocular movements were full, visual field were full  Motor: The motor testing reveals 5 over 5 strength of all 4 extremities. Good symmetric motor tone is noted throughout.  Gait and station: Gait is normal.    DIAGNOSTIC DATA (LABS, IMAGING, TESTING) - I reviewed patient records, labs, notes, testing and imaging myself where available.  No data to display           Lab Results  Component Value Date   WBC 18.3 (H) 05/29/2019   HGB 16.8 05/29/2019   HCT 49.6 05/29/2019   MCV 87.0 05/29/2019   PLT 308 05/29/2019      Component Value Date/Time   NA 138 06/02/2019 0211   K 3.5 06/02/2019 0211   CL 106 06/02/2019 0211   CO2 23 06/02/2019 0211   GLUCOSE 112 (H) 06/02/2019 0211   BUN 10 06/02/2019 0211   CREATININE 0.63 06/02/2019 0211   CALCIUM 8.1 (L) 06/02/2019 0211   PROT 5.2 (L) 06/03/2019 0139   ALBUMIN 2.1 (L) 06/03/2019 0139   AST 11 (L) 06/03/2019 0139   ALT 11 06/03/2019 0139   ALKPHOS 58 06/03/2019 0139   BILITOT 0.4 06/03/2019 0139   GFRNONAA >60 06/02/2019 0211   GFRAA >60 06/02/2019 0211   Lab Results  Component Value Date   TRIG 787 (H) 05/29/2019   Lab Results  Component Value Date   HGBA1C 13.1 (H) 05/30/2019   No results found for: VITAMINB12 No results found for: TSH   ASSESSMENT AND PLAN 48 y.o. year old male  has a past medical history of ADD (attention deficit disorder) (02/23/2018), Depression (02/23/2018), and Diabetes type I (HCC). here with   No diagnosis found.    Benjamin [name] is doing well on CPAP therapy. Compliance report reveals excellent compliance. He was encouraged to continue using CPAP nightly and for greater than 4 hours each night. We have reviewed AHI of 5.5/h and elevated leak at 53l/min. I have encouraged him to monitor at home. He will replace mask as directed. Consider  Biotene products for dry mouth. We will update supply orders as indicated. Risks of untreated sleep apnea review and education materials provided. Healthy lifestyle habits encouraged. I will recheck compliance report in 6-8 weeks to assess leak and AHI. He will follow up in 1 year, sooner if needed. He verbalizes understanding and agreement with this plan.    No orders of the defined types were placed in this encounter.    No orders of the defined types were placed in this encounter.     Greig Forbes, FNP-C 01/23/2024, 11:26 AM [facility] [address] [place], [place] [address] [phone]

## 2024-01-23 NOTE — Patient Instructions (Incomplete)

## 2024-01-23 NOTE — Telephone Encounter (Signed)
 Called pt and asked him to bring his CPAP Machine with him to his Appointment on tomorrow 01/24/2024.

## 2024-01-24 ENCOUNTER — Encounter: Payer: Self-pay | Admitting: Family Medicine

## 2024-01-24 ENCOUNTER — Ambulatory Visit: Payer: BC Managed Care – PPO | Admitting: Family Medicine

## 2024-01-24 VITALS — BP 146/76 | HR 90 | Ht 72.0 in | Wt 285.0 lb

## 2024-01-24 DIAGNOSIS — E66811 Obesity, class 1: Secondary | ICD-10-CM

## 2024-01-24 DIAGNOSIS — G4733 Obstructive sleep apnea (adult) (pediatric): Secondary | ICD-10-CM

## 2024-01-25 DIAGNOSIS — E119 Type 2 diabetes mellitus without complications: Secondary | ICD-10-CM | POA: Diagnosis not present

## 2024-01-25 DIAGNOSIS — E1165 Type 2 diabetes mellitus with hyperglycemia: Secondary | ICD-10-CM | POA: Diagnosis not present

## 2024-01-25 DIAGNOSIS — Z794 Long term (current) use of insulin: Secondary | ICD-10-CM | POA: Diagnosis not present

## 2024-01-29 DIAGNOSIS — E1022 Type 1 diabetes mellitus with diabetic chronic kidney disease: Secondary | ICD-10-CM | POA: Diagnosis not present

## 2024-01-30 DIAGNOSIS — F4325 Adjustment disorder with mixed disturbance of emotions and conduct: Secondary | ICD-10-CM | POA: Diagnosis not present

## 2024-02-06 DIAGNOSIS — F4325 Adjustment disorder with mixed disturbance of emotions and conduct: Secondary | ICD-10-CM | POA: Diagnosis not present

## 2024-02-18 ENCOUNTER — Other Ambulatory Visit (HOSPITAL_BASED_OUTPATIENT_CLINIC_OR_DEPARTMENT_OTHER): Payer: Self-pay

## 2024-02-18 DIAGNOSIS — M79672 Pain in left foot: Secondary | ICD-10-CM | POA: Diagnosis not present

## 2024-02-18 MED ORDER — MELOXICAM 15 MG PO TABS
15.0000 mg | ORAL_TABLET | Freq: Every day | ORAL | 0 refills | Status: AC
  Filled 2024-02-18: qty 30, 30d supply, fill #0

## 2024-02-28 ENCOUNTER — Encounter: Payer: Self-pay | Admitting: Family Medicine

## 2024-03-20 DIAGNOSIS — F4325 Adjustment disorder with mixed disturbance of emotions and conduct: Secondary | ICD-10-CM | POA: Diagnosis not present

## 2024-03-27 DIAGNOSIS — F4325 Adjustment disorder with mixed disturbance of emotions and conduct: Secondary | ICD-10-CM | POA: Diagnosis not present

## 2024-04-03 DIAGNOSIS — F4325 Adjustment disorder with mixed disturbance of emotions and conduct: Secondary | ICD-10-CM | POA: Diagnosis not present

## 2024-04-10 DIAGNOSIS — F4325 Adjustment disorder with mixed disturbance of emotions and conduct: Secondary | ICD-10-CM | POA: Diagnosis not present

## 2024-04-17 DIAGNOSIS — F4325 Adjustment disorder with mixed disturbance of emotions and conduct: Secondary | ICD-10-CM | POA: Diagnosis not present

## 2024-04-22 DIAGNOSIS — E119 Type 2 diabetes mellitus without complications: Secondary | ICD-10-CM | POA: Diagnosis not present

## 2024-04-22 DIAGNOSIS — E1165 Type 2 diabetes mellitus with hyperglycemia: Secondary | ICD-10-CM | POA: Diagnosis not present

## 2024-04-22 DIAGNOSIS — Z794 Long term (current) use of insulin: Secondary | ICD-10-CM | POA: Diagnosis not present

## 2024-04-24 DIAGNOSIS — F4325 Adjustment disorder with mixed disturbance of emotions and conduct: Secondary | ICD-10-CM | POA: Diagnosis not present

## 2024-05-15 DIAGNOSIS — F4325 Adjustment disorder with mixed disturbance of emotions and conduct: Secondary | ICD-10-CM | POA: Diagnosis not present

## 2024-05-19 DIAGNOSIS — E113311 Type 2 diabetes mellitus with moderate nonproliferative diabetic retinopathy with macular edema, right eye: Secondary | ICD-10-CM | POA: Diagnosis not present

## 2024-05-19 DIAGNOSIS — H3582 Retinal ischemia: Secondary | ICD-10-CM | POA: Diagnosis not present

## 2024-05-19 DIAGNOSIS — G4733 Obstructive sleep apnea (adult) (pediatric): Secondary | ICD-10-CM | POA: Diagnosis not present

## 2024-05-19 DIAGNOSIS — H35371 Puckering of macula, right eye: Secondary | ICD-10-CM | POA: Diagnosis not present

## 2024-05-19 DIAGNOSIS — E113512 Type 2 diabetes mellitus with proliferative diabetic retinopathy with macular edema, left eye: Secondary | ICD-10-CM | POA: Diagnosis not present

## 2024-05-26 DIAGNOSIS — Z1212 Encounter for screening for malignant neoplasm of rectum: Secondary | ICD-10-CM | POA: Diagnosis not present

## 2024-05-26 DIAGNOSIS — R82998 Other abnormal findings in urine: Secondary | ICD-10-CM | POA: Diagnosis not present

## 2024-05-26 DIAGNOSIS — Z0189 Encounter for other specified special examinations: Secondary | ICD-10-CM | POA: Diagnosis not present

## 2024-06-12 ENCOUNTER — Other Ambulatory Visit (HOSPITAL_BASED_OUTPATIENT_CLINIC_OR_DEPARTMENT_OTHER): Payer: Self-pay

## 2024-06-12 MED ORDER — REPATHA SURECLICK 140 MG/ML ~~LOC~~ SOAJ
140.0000 mg | SUBCUTANEOUS | 11 refills | Status: AC
  Filled 2024-06-12: qty 2, 28d supply, fill #0

## 2024-06-20 ENCOUNTER — Other Ambulatory Visit (HOSPITAL_BASED_OUTPATIENT_CLINIC_OR_DEPARTMENT_OTHER): Payer: Self-pay

## 2024-06-20 MED ORDER — INSULIN LISPRO 100 UNIT/ML IJ SOLN
200.0000 [IU] | Freq: Every day | INTRAMUSCULAR | 3 refills | Status: AC
  Filled 2024-06-20: qty 90, 45d supply, fill #0

## 2024-06-21 ENCOUNTER — Other Ambulatory Visit (HOSPITAL_BASED_OUTPATIENT_CLINIC_OR_DEPARTMENT_OTHER): Payer: Self-pay

## 2024-06-23 ENCOUNTER — Other Ambulatory Visit (HOSPITAL_BASED_OUTPATIENT_CLINIC_OR_DEPARTMENT_OTHER): Payer: Self-pay

## 2024-06-25 ENCOUNTER — Other Ambulatory Visit (HOSPITAL_BASED_OUTPATIENT_CLINIC_OR_DEPARTMENT_OTHER): Payer: Self-pay

## 2024-06-25 MED ORDER — INSULIN LISPRO 100 UNIT/ML IJ SOLN
INTRAMUSCULAR | 0 refills | Status: AC
  Filled 2024-06-25: qty 10, 14d supply, fill #0
  Filled 2024-07-01 (×2): qty 30, 15d supply, fill #0

## 2024-06-26 ENCOUNTER — Other Ambulatory Visit (HOSPITAL_BASED_OUTPATIENT_CLINIC_OR_DEPARTMENT_OTHER): Payer: Self-pay

## 2024-06-27 ENCOUNTER — Other Ambulatory Visit (HOSPITAL_BASED_OUTPATIENT_CLINIC_OR_DEPARTMENT_OTHER): Payer: Self-pay

## 2024-07-01 ENCOUNTER — Other Ambulatory Visit (HOSPITAL_BASED_OUTPATIENT_CLINIC_OR_DEPARTMENT_OTHER): Payer: Self-pay

## 2024-07-05 ENCOUNTER — Other Ambulatory Visit (HOSPITAL_BASED_OUTPATIENT_CLINIC_OR_DEPARTMENT_OTHER): Payer: Self-pay

## 2025-01-26 ENCOUNTER — Ambulatory Visit: Admitting: Family Medicine

## 2025-01-29 ENCOUNTER — Ambulatory Visit: Admitting: Family Medicine
